# Patient Record
Sex: Female | Born: 1985 | Race: Black or African American | Hispanic: No | Marital: Single | Smoking: Never smoker
Health system: Southern US, Community
[De-identification: ages and names within clinical notes are randomized; demographics above are authoritative.]

## PROBLEM LIST (undated history)

## (undated) DIAGNOSIS — N939 Abnormal uterine and vaginal bleeding, unspecified: Secondary | ICD-10-CM

## (undated) DIAGNOSIS — D649 Anemia, unspecified: Secondary | ICD-10-CM

## (undated) DIAGNOSIS — J45909 Unspecified asthma, uncomplicated: Secondary | ICD-10-CM

## (undated) DIAGNOSIS — G43909 Migraine, unspecified, not intractable, without status migrainosus: Secondary | ICD-10-CM

## (undated) DIAGNOSIS — Y249XXA Unspecified firearm discharge, undetermined intent, initial encounter: Secondary | ICD-10-CM

## (undated) DIAGNOSIS — W3400XA Accidental discharge from unspecified firearms or gun, initial encounter: Secondary | ICD-10-CM

## (undated) HISTORY — PX: SPLENECTOMY: SUR1306

## (undated) HISTORY — PX: CHOLECYSTECTOMY: SHX55

## (undated) HISTORY — PX: LUNG REMOVAL, PARTIAL: SHX233

---

## 2011-03-15 DIAGNOSIS — Z87828 Personal history of other (healed) physical injury and trauma: Secondary | ICD-10-CM | POA: Insufficient documentation

## 2015-09-21 DIAGNOSIS — R630 Anorexia: Secondary | ICD-10-CM | POA: Insufficient documentation

## 2015-09-21 DIAGNOSIS — Z902 Acquired absence of lung [part of]: Secondary | ICD-10-CM | POA: Insufficient documentation

## 2015-09-21 DIAGNOSIS — F4310 Post-traumatic stress disorder, unspecified: Secondary | ICD-10-CM | POA: Insufficient documentation

## 2015-09-21 DIAGNOSIS — J45909 Unspecified asthma, uncomplicated: Secondary | ICD-10-CM | POA: Insufficient documentation

## 2016-03-15 DIAGNOSIS — O42919 Preterm premature rupture of membranes, unspecified as to length of time between rupture and onset of labor, unspecified trimester: Secondary | ICD-10-CM | POA: Insufficient documentation

## 2017-02-20 DIAGNOSIS — Z8719 Personal history of other diseases of the digestive system: Secondary | ICD-10-CM | POA: Insufficient documentation

## 2017-02-20 DIAGNOSIS — Z9889 Other specified postprocedural states: Secondary | ICD-10-CM | POA: Insufficient documentation

## 2019-01-30 ENCOUNTER — Encounter (HOSPITAL_COMMUNITY): Payer: Self-pay

## 2019-01-30 ENCOUNTER — Emergency Department (HOSPITAL_COMMUNITY)
Admission: EM | Admit: 2019-01-30 | Discharge: 2019-01-31 | Discharge status: Against Medical Advice | Payer: Medicaid Other | Attending: Emergency Medicine | Admitting: Emergency Medicine

## 2019-01-30 ENCOUNTER — Emergency Department (HOSPITAL_COMMUNITY): Payer: Medicaid Other

## 2019-01-30 DIAGNOSIS — Z5321 Procedure and treatment not carried out due to patient leaving prior to being seen by health care provider: Secondary | ICD-10-CM | POA: Diagnosis not present

## 2019-01-30 DIAGNOSIS — R509 Fever, unspecified: Secondary | ICD-10-CM | POA: Diagnosis present

## 2019-01-30 HISTORY — DX: Accidental discharge from unspecified firearms or gun, initial encounter: W34.00XA

## 2019-01-30 HISTORY — DX: Unspecified firearm discharge, undetermined intent, initial encounter: Y24.9XXA

## 2019-01-30 HISTORY — DX: Anemia, unspecified: D64.9

## 2019-01-30 LAB — POC SARS CORONAVIRUS 2 AG -  ED: SARS Coronavirus 2 Ag: NEGATIVE

## 2019-01-30 NOTE — ED Triage Notes (Signed)
Pt reports that for the past 2 days she has been having fevers, body aches, diarrhea, dizziness, and loss of appetite

## 2019-01-31 NOTE — ED Notes (Signed)
Pt left AMA and was told by this tech to stay. Pt refused.

## 2019-06-07 ENCOUNTER — Ambulatory Visit
Admission: EM | Admit: 2019-06-07 | Discharge: 2019-06-07 | Disposition: A | Discharge status: Home / Self Care | Payer: Medicaid Other | Attending: Physician Assistant | Admitting: Physician Assistant

## 2019-06-07 DIAGNOSIS — R112 Nausea with vomiting, unspecified: Secondary | ICD-10-CM

## 2019-06-07 LAB — POCT URINALYSIS DIP (MANUAL ENTRY)
Bilirubin, UA: NEGATIVE
Glucose, UA: NEGATIVE mg/dL
Ketones, POC UA: NEGATIVE mg/dL
Leukocytes, UA: NEGATIVE
Nitrite, UA: NEGATIVE
Protein Ur, POC: NEGATIVE mg/dL
Spec Grav, UA: >=1.03 — AB (ref 1.010–1.025)
Urobilinogen, UA: 0.2 E.U./dL
pH, UA: 7 (ref 5.0–8.0)

## 2019-06-07 LAB — POCT URINE PREGNANCY: Preg Test, Ur: NEGATIVE

## 2019-06-07 MED ORDER — ONDANSETRON 4 MG PO TBDP: 4.0000 mg | ORAL_TABLET | Freq: Three times a day (TID) | ORAL | 0 refills | Status: DC | PRN

## 2019-06-07 MED ORDER — FAMOTIDINE 20 MG PO TABS: 20.0000 mg | ORAL_TABLET | Freq: Two times a day (BID) | ORAL | 0 refills | Status: DC
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
## 2019-06-07 NOTE — Discharge Instructions (Signed)
Urine negative for infection/pregnancy at this time. Start zofran as needed for nausea/vomiting. Pepcid for acid reflux. Follow up with PCP/GYN for further evaluation if symptoms not improving.

## 2019-06-07 NOTE — ED Triage Notes (Signed)
Pt c/o vomiting x1wk, unable to keep anything down. C/o mid back and abdominal pain d/t vomiting so much. States may be pregnant

## 2019-08-08 ENCOUNTER — Other Ambulatory Visit: Payer: Self-pay

## 2019-08-08 ENCOUNTER — Inpatient Hospital Stay (HOSPITAL_COMMUNITY)
Admission: AD | Admit: 2019-08-08 | Discharge: 2019-08-08 | Disposition: A | Discharge status: Home / Self Care | Payer: Medicaid Other | Attending: Obstetrics and Gynecology | Admitting: Obstetrics and Gynecology

## 2019-08-08 ENCOUNTER — Encounter (HOSPITAL_COMMUNITY): Payer: Self-pay | Admitting: Obstetrics and Gynecology

## 2019-08-08 DIAGNOSIS — R102 Pelvic and perineal pain: Secondary | ICD-10-CM | POA: Diagnosis not present

## 2019-08-08 DIAGNOSIS — Z881 Allergy status to other antibiotic agents status: Secondary | ICD-10-CM | POA: Insufficient documentation

## 2019-08-08 DIAGNOSIS — Z79899 Other long term (current) drug therapy: Secondary | ICD-10-CM | POA: Diagnosis not present

## 2019-08-08 DIAGNOSIS — O26891 Other specified pregnancy related conditions, first trimester: Secondary | ICD-10-CM | POA: Diagnosis not present

## 2019-08-08 DIAGNOSIS — K5909 Other constipation: Secondary | ICD-10-CM | POA: Diagnosis not present

## 2019-08-08 DIAGNOSIS — O99611 Diseases of the digestive system complicating pregnancy, first trimester: Secondary | ICD-10-CM | POA: Insufficient documentation

## 2019-08-08 DIAGNOSIS — Z3A01 Less than 8 weeks gestation of pregnancy: Secondary | ICD-10-CM | POA: Diagnosis not present

## 2019-08-08 LAB — URINALYSIS, ROUTINE W REFLEX MICROSCOPIC
Bilirubin Urine: NEGATIVE
Glucose, UA: NEGATIVE mg/dL
Ketones, ur: NEGATIVE mg/dL
Leukocytes,Ua: NEGATIVE
Nitrite: POSITIVE — AB
Protein, ur: NEGATIVE mg/dL
Specific Gravity, Urine: 1.025 (ref 1.005–1.030)
pH: 6.5 (ref 5.0–8.0)

## 2019-08-08 LAB — URINALYSIS, MICROSCOPIC (REFLEX)

## 2019-08-08 LAB — POCT PREGNANCY, URINE: Preg Test, Ur: POSITIVE — AB

## 2019-08-08 MED ORDER — DOXYLAMINE-PYRIDOXINE 10-10 MG PO TBEC: 2.0000 | DELAYED_RELEASE_TABLET | Freq: Every day | ORAL | 1 refills | Status: DC

## 2019-08-08 NOTE — Discharge Instructions (Signed)
You have constipation which is hard stools that are difficult to pass. It is important to have regular bowel movements every 1-3 days that are soft and easy to pass. Hard stools increase your risk of hemorrhoids and are very uncomfortable.   To prevent constipation you can increase the amount of fiber in your diet. Examples of foods with fiber are leafy greens, whole grain breads, oatmeal and other grains.  It is also important to drink at least eight 8oz glass of water everyday.   If you have not has a bowel movement in 4-5 days you made need to clean out your bowel.  This will have establish normal movement through your bowel.    Miralax Clean out  Take 8 capfuls of miralax in 64 oz of gatorade. You can use any fluid that appeals to you (gatorade, water, juice)  Continue to drink at least eight 8 oz glasses of water throughout the day  You can repeat with another 8 capfuls of miralax in 64 oz of gatorade if you are not having a large amount of stools  You will need to be at home and close to a bathroom for about 8 hours when you do the above as you may need to go to the bathroom frequently.   After you are cleaned out: - Start Colace100mg  twice daily - Start Miralax once daily - Start a daily fiber supplement like metamucil or citrucel - You can safely use enemas in pregnancy  - if you are having diarrhea you can reduce to Colace once a day or miralax every other day or a 1/2 capful daily.    Round Ligament Pain  The round ligament is a cord of muscle and tissue that helps support the uterus. It can become a source of pain during pregnancy if it becomes stretched or twisted as the baby grows. The pain usually begins in the second trimester (13-28 weeks) of pregnancy, and it can come and go until the baby is delivered. It is not a serious problem, and it does not cause harm to the baby. Round ligament pain is usually a short, sharp, and pinching pain, but it can also be a dull, lingering,  and aching pain. The pain is felt in the lower side of the abdomen or in the groin. It usually starts deep in the groin and moves up to the outside of the hip area. The pain may occur when you:  Suddenly change position, such as quickly going from a sitting to standing position.  Roll over in bed.  Cough or sneeze.  Do physical activity. Follow these instructions at home:   Watch your condition for any changes.  When the pain starts, relax. Then try any of these methods to help with the pain: ? Sitting down. ? Flexing your knees up to your abdomen. ? Lying on your side with one pillow under your abdomen and another pillow between your legs. ? Sitting in a warm bath for 15-20 minutes or until the pain goes away.  Take over-the-counter and prescription medicines only as told by your health care provider.  Move slowly when you sit down or stand up.  Avoid long walks if they cause pain.  Stop or reduce your physical activities if they cause pain.  Keep all follow-up visits as told by your health care provider. This is important. Contact a health care provider if:  Your pain does not go away with treatment.  You feel pain in your back that you  did not have before.  Your medicine is not helping. Get help right away if:  You have a fever or chills.  You develop uterine contractions.  You have vaginal bleeding.  You have nausea or vomiting.  You have diarrhea.  You have pain when you urinate. Summary  Round ligament pain is felt in the lower abdomen or groin. It is usually a short, sharp, and pinching pain. It can also be a dull, lingering, and aching pain.  This pain usually begins in the second trimester (13-28 weeks). It occurs because the uterus is stretching with the growing baby, and it is not harmful to the baby.  You may notice the pain when you suddenly change position, when you cough or sneeze, or during physical activity.  Relaxing, flexing your knees to  your abdomen, lying on one side, or taking a warm bath may help to get rid of the pain.  Get help from your health care provider if the pain does not go away or if you have vaginal bleeding, nausea, vomiting, diarrhea, or painful urination. This information is not intended to replace advice given to you by your health care provider. Make sure you discuss any questions you have with your health care provider. Document Revised: 07/01/2017 Document Reviewed: 07/01/2017 Elsevier Patient Education  2020 [employer].   Constipation, Adult Constipation is when a person:  Poops (has a bowel movement) fewer times in a week than normal.  Has a hard time pooping.  Has poop that is dry, hard, or bigger than normal. Follow these instructions at home: Eating and drinking   Eat foods that have a lot of fiber, such as: ? Fresh fruits and vegetables. ? Whole grains. ? Beans.  Eat less of foods that are high in fat, low in fiber, or overly processed, such as: ? [place] fries. ? Hamburgers. ? Cookies. ? Candy. ? Soda.  Drink enough fluid to keep your pee (urine) clear or pale yellow. General instructions  Exercise regularly or as told by your doctor.  Go to the restroom when you feel like you need to poop. Do not hold it in.  Take over-the-counter and prescription medicines only as told by your doctor. These include any fiber supplements.  Do pelvic floor retraining exercises, such as: ? Doing deep breathing while relaxing your lower belly (abdomen). ? Relaxing your pelvic floor while pooping.  Watch your condition for any changes.  Keep all follow-up visits as told by your doctor. This is important. Contact a doctor if:  You have pain that gets worse.  You have a fever.  You have not pooped for 4 days.  You throw up (vomit).  You are not hungry.  You lose weight.  You are bleeding from the anus.  You have thin, pencil-like poop (stool). Get help right away if:  You  have a fever, and your symptoms suddenly get worse.  You leak poop or have blood in your poop.  Your belly feels hard or bigger than normal (is bloated).  You have very bad belly pain.  You feel dizzy or you faint. This information is not intended to replace advice given to you by your health care provider. Make sure you discuss any questions you have with your health care provider. Document Revised: 12/26/2016 Document Reviewed: 07/04/2015 Elsevier Patient Education  2020 [employer]vier [employer].  [facility] Providers    [facility] [employer] [facility]       Phone: [phone]  [facility] [employer]: [phone]  [facility] [employer] at [place]  Phone: [phone]  [facility] [employer] at [employer]  Phone: [phone]  [facility] [employer] [facility] [place]  Phone: [phone]  [facility]   Phone: [phone]  [facility]       Phone: [phone]  [facility]    Phone: [phone]   [facility]    Phone: [phone]  [facility]    Phone: [phone]  [facility]    Phone: [phone]  [facility]       Phone: [phone]   [facility]  Phone: [phone]  [facility]    Phone: [phone]  [facility]   Phone: [phone]  [facility]      Phone: [phone]  [facility]    Phone: [phone]
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
## 2019-08-08 NOTE — MAU Note (Signed)
.   Rebecca Hatfield is a 34 y.o. at Unknown here in MAU reporting: she had a positive pregnancy test 2 days ago and she has swelling in her legs and she is constipated.  LMP: 06/28/19 Onset of complaint: 2 days Pain score: 8 Vitals:   08/08/19 1406  BP: 135/80  Pulse: 97  Resp: 16  Temp: 98.7 F (37.1 C)  SpO2: 100%     FHT: Lab orders placed from triage: UPT

## 2019-09-02 ENCOUNTER — Inpatient Hospital Stay (EMERGENCY_DEPARTMENT_HOSPITAL)
Admission: AD | Admit: 2019-09-02 | Discharge: 2019-09-03 | Disposition: A | Discharge status: Home / Self Care | Payer: Medicaid Other | Source: Home / Self Care | Attending: Obstetrics and Gynecology | Admitting: Obstetrics and Gynecology

## 2019-09-02 ENCOUNTER — Other Ambulatory Visit: Payer: Self-pay

## 2019-09-02 ENCOUNTER — Encounter (HOSPITAL_COMMUNITY): Payer: Self-pay | Admitting: Obstetrics and Gynecology

## 2019-09-02 ENCOUNTER — Inpatient Hospital Stay (HOSPITAL_COMMUNITY): Payer: Medicaid Other

## 2019-09-02 DIAGNOSIS — Z79899 Other long term (current) drug therapy: Secondary | ICD-10-CM | POA: Insufficient documentation

## 2019-09-02 DIAGNOSIS — M549 Dorsalgia, unspecified: Secondary | ICD-10-CM | POA: Diagnosis not present

## 2019-09-02 DIAGNOSIS — A5901 Trichomonal vulvovaginitis: Secondary | ICD-10-CM

## 2019-09-02 DIAGNOSIS — O26891 Other specified pregnancy related conditions, first trimester: Secondary | ICD-10-CM

## 2019-09-02 DIAGNOSIS — Z881 Allergy status to other antibiotic agents status: Secondary | ICD-10-CM | POA: Insufficient documentation

## 2019-09-02 DIAGNOSIS — Z5321 Procedure and treatment not carried out due to patient leaving prior to being seen by health care provider: Secondary | ICD-10-CM | POA: Diagnosis not present

## 2019-09-02 DIAGNOSIS — Z3A09 9 weeks gestation of pregnancy: Secondary | ICD-10-CM | POA: Diagnosis not present

## 2019-09-02 DIAGNOSIS — R109 Unspecified abdominal pain: Secondary | ICD-10-CM | POA: Diagnosis not present

## 2019-09-02 DIAGNOSIS — Z3A01 Less than 8 weeks gestation of pregnancy: Secondary | ICD-10-CM

## 2019-09-02 DIAGNOSIS — B9689 Other specified bacterial agents as the cause of diseases classified elsewhere: Secondary | ICD-10-CM

## 2019-09-02 DIAGNOSIS — N760 Acute vaginitis: Secondary | ICD-10-CM

## 2019-09-02 DIAGNOSIS — O23591 Infection of other part of genital tract in pregnancy, first trimester: Secondary | ICD-10-CM | POA: Insufficient documentation

## 2019-09-02 DIAGNOSIS — R0602 Shortness of breath: Secondary | ICD-10-CM | POA: Diagnosis present

## 2019-09-02 DIAGNOSIS — Z9049 Acquired absence of other specified parts of digestive tract: Secondary | ICD-10-CM | POA: Insufficient documentation

## 2019-09-02 LAB — URINALYSIS, ROUTINE W REFLEX MICROSCOPIC
Bilirubin Urine: NEGATIVE
Glucose, UA: NEGATIVE mg/dL
Hgb urine dipstick: NEGATIVE
Ketones, ur: NEGATIVE mg/dL
Leukocytes,Ua: NEGATIVE
Nitrite: NEGATIVE
Protein, ur: NEGATIVE mg/dL
Specific Gravity, Urine: 1.015 (ref 1.005–1.030)
pH: 7 (ref 5.0–8.0)

## 2019-09-02 LAB — WET PREP, GENITAL
Sperm: NONE SEEN
Yeast Wet Prep HPF POC: NONE SEEN

## 2019-09-02 MED ORDER — CYCLOBENZAPRINE HCL 5 MG PO TABS
10.0000 mg | ORAL_TABLET | Freq: Once | ORAL | Status: AC
  Administered 2019-09-02: 10 mg via ORAL
  Filled 2019-09-02: qty 2
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
## 2019-09-03 ENCOUNTER — Encounter (HOSPITAL_COMMUNITY): Payer: Self-pay | Admitting: Emergency Medicine

## 2019-09-03 ENCOUNTER — Emergency Department (HOSPITAL_COMMUNITY)
Admission: EM | Admit: 2019-09-03 | Discharge: 2019-09-03 | Disposition: A | Discharge status: Home / Self Care | Payer: Medicaid Other | Attending: Emergency Medicine | Admitting: Emergency Medicine

## 2019-09-03 ENCOUNTER — Emergency Department (HOSPITAL_COMMUNITY): Payer: Medicaid Other

## 2019-09-03 ENCOUNTER — Other Ambulatory Visit: Payer: Self-pay

## 2019-09-03 DIAGNOSIS — O26891 Other specified pregnancy related conditions, first trimester: Secondary | ICD-10-CM

## 2019-09-03 DIAGNOSIS — R109 Unspecified abdominal pain: Secondary | ICD-10-CM

## 2019-09-03 DIAGNOSIS — R0602 Shortness of breath: Secondary | ICD-10-CM | POA: Insufficient documentation

## 2019-09-03 DIAGNOSIS — Z5321 Procedure and treatment not carried out due to patient leaving prior to being seen by health care provider: Secondary | ICD-10-CM | POA: Insufficient documentation

## 2019-09-03 DIAGNOSIS — M549 Dorsalgia, unspecified: Secondary | ICD-10-CM

## 2019-09-03 DIAGNOSIS — Z3A09 9 weeks gestation of pregnancy: Secondary | ICD-10-CM

## 2019-09-03 MED ORDER — METRONIDAZOLE 0.75 % VA GEL: 1.0000 | Freq: Every day | VAGINAL | 0 refills | Status: DC

## 2019-09-03 MED ORDER — METRONIDAZOLE 500 MG PO TABS
2000.0000 mg | ORAL_TABLET | Freq: Once | ORAL | Status: AC
  Administered 2019-09-03: 2000 mg via ORAL
  Filled 2019-09-03: qty 4

## 2019-09-03 MED ORDER — TERCONAZOLE 0.8 % VA CREA: 1.0000 | TOPICAL_CREAM | Freq: Every day | VAGINAL | 0 refills | Status: DC
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
## 2019-09-03 NOTE — ED Notes (Signed)
Pt left due to not being seen quick enough 

## 2019-09-03 NOTE — MAU Note (Signed)
Pt transfered to [facility]. Report called to [facility] triage nurse.

## 2019-09-03 NOTE — Discharge Instructions (Signed)
Trichomoniasis Trichomoniasis is an STI (sexually transmitted infection) that can affect both women and men. In women, the outer area of the female genitalia (vulva) and the vagina are affected. In men, mainly the penis is affected, but the prostate and other reproductive organs can also be involved.  This condition can be treated with medicine. It often has no symptoms (is asymptomatic), especially in men. If not treated, trichomoniasis can last for months or years. What are the causes? This condition is caused by a parasite called Trichomonas vaginalis. Trichomoniasis most often spreads from person to person (is contagious) through sexual contact. What increases the risk? The following factors may make you more likely to develop this condition:  Having unprotected sex.  Having sex with a partner who has trichomoniasis.  Having multiple sexual partners.  Having had previous trichomoniasis infections or other STIs. What are the signs or symptoms? In women, symptoms of trichomoniasis include:  Abnormal vaginal discharge that is clear, white, gray, or yellow-green and foamy and has an unusual "fishy" odor.  Itching and irritation of the vagina and vulva.  Burning or pain during urination or sex.  Redness and swelling of the genitals. In men, symptoms of trichomoniasis include:  Penile discharge that may be foamy or contain pus.  Pain in the penis. This may happen only when urinating.  Itching or irritation inside the penis.  Burning after urination or ejaculation. How is this diagnosed? In women, this condition may be found during a routine Pap test or physical exam. It may be found in men during a routine physical exam. Your health care provider may do tests to help diagnose this infection, such as:  Urine tests (men and women).  The following in women: ? Testing the pH of the vagina. ? A vaginal swab test that checks for the Trichomonas vaginalis parasite. ? Testing vaginal  secretions. Your health care provider may test you for other STIs, including HIV (human immunodeficiency virus). How is this treated? This condition is treated with medicine taken by mouth (orally), such as metronidazole or tinidazole, to fight the infection. Your sexual partner(s) also need to be tested and treated.  If you are a woman and you plan to become pregnant or think you may be pregnant, tell your health care provider right away. Some medicines that are used to treat the infection should not be taken during pregnancy. Your health care provider may recommend over-the-counter medicines or creams to help relieve itching or irritation. You may be tested for infection again 3 months after treatment. Follow these instructions at home:  Take and use over-the-counter and prescription medicines, including creams, only as told by your health care provider.  Take your antibiotic medicine as told by your health care provider. Do not stop taking the antibiotic even if you start to feel better.  Do not have sex until 7-10 days after you finish your medicine, or until your health care provider approves. Ask your health care provider when you may start to have sex again.  (Women) Do not douche or wear tampons while you have the infection.  Discuss your infection with your sexual partner(s). Make sure that your partner gets tested and treated, if necessary.  Keep all follow-up visits as told by your health care provider. This is important. How is this prevented?   Use condoms every time you have sex. Using condoms correctly and consistently can help protect against STIs.  Avoid having multiple sexual partners.  Talk with your sexual partner about any   symptoms that either of you may have, as well as any history of STIs.  Get tested for STIs and STDs (sexually transmitted diseases) before you have sex. Ask your partner to do the same.  Do not have sexual contact if you have symptoms of  trichomoniasis or another STI. Contact a health care provider if:  You still have symptoms after you finish your medicine.  You develop pain in your abdomen.  You have pain when you urinate.  You have bleeding after sex.  You develop a rash.  You feel nauseous or you vomit.  You plan to become pregnant or think you may be pregnant. Summary  Trichomoniasis is an STI (sexually transmitted infection) that can affect both women and men.  This condition often has no symptoms (is asymptomatic), especially in men.  Without treatment, this condition can last for months or years.  You should not have sex until 7-10 days after you finish your medicine, or until your health care provider approves. Ask your health care provider when you may start to have sex again.  Discuss your infection with your sexual partner(s). Make sure that your partner gets tested and treated, if necessary. This information is not intended to replace advice given to you by your health care provider. Make sure you discuss any questions you have with your health care provider. Document Revised: 10/27/2017 Document Reviewed: 10/27/2017 Elsevier Patient Education  2020 Elsevier Inc.  

## 2019-09-05 LAB — GC/CHLAMYDIA PROBE AMP (~~LOC~~) NOT AT ARMC
Chlamydia: NEGATIVE
Comment: NEGATIVE
Comment: NORMAL
Neisseria Gonorrhea: NEGATIVE

## 2019-09-06 ENCOUNTER — Ambulatory Visit: Payer: Medicaid Other

## 2019-09-06 ENCOUNTER — Other Ambulatory Visit: Payer: Self-pay

## 2019-09-06 DIAGNOSIS — A599 Trichomoniasis, unspecified: Secondary | ICD-10-CM

## 2019-09-06 DIAGNOSIS — O0990 Supervision of high risk pregnancy, unspecified, unspecified trimester: Secondary | ICD-10-CM | POA: Insufficient documentation

## 2019-09-06 DIAGNOSIS — Z3491 Encounter for supervision of normal pregnancy, unspecified, first trimester: Secondary | ICD-10-CM

## 2019-09-06 MED ORDER — METRONIDAZOLE 500 MG PO TABS: ORAL_TABLET | ORAL | 0 refills | Status: DC

## 2019-09-06 MED ORDER — BLOOD PRESSURE KIT DEVI: 1.0000 | 0 refills | Status: DC

## 2019-09-06 NOTE — Progress Notes (Signed)
PRENATAL INTAKE SUMMARY  Ms. Draughon presents today New OB Nurse Interview.  OB History    Gravida  5   Para  2   Term  2   Preterm      AB  2   Living  2     SAB  1   TAB  1   Ectopic      Multiple      Live Births  2          I have reviewed the patient's medical, obstetrical, social, and family histories, medications, and available lab results.  SUBJECTIVE She has no unusual complaints. Patient states that she was not able to keep treatment for her trichomonas diagnosis down and would like rx to be sent to the pharmacy again.   OBJECTIVE Initial Physical Exam (New OB)  GENERAL APPEARANCE: alert, well appearing   ASSESSMENT Normal pregnancy  PLAN Prenatal care will be at [facility] labs will be completed at [facility] OB visit

## 2019-09-14 ENCOUNTER — Encounter: Payer: Medicaid Other | Admitting: Obstetrics and Gynecology

## 2019-09-22 ENCOUNTER — Encounter: Payer: Medicaid Other | Admitting: Obstetrics

## 2019-10-13 ENCOUNTER — Encounter: Payer: Self-pay | Admitting: Obstetrics

## 2019-10-13 ENCOUNTER — Ambulatory Visit (INDEPENDENT_AMBULATORY_CARE_PROVIDER_SITE_OTHER): Payer: Medicaid Other | Admitting: Obstetrics

## 2019-10-13 ENCOUNTER — Other Ambulatory Visit (HOSPITAL_COMMUNITY)
Admission: RE | Admit: 2019-10-13 | Discharge: 2019-10-13 | Disposition: A | Discharge status: Home / Self Care | Payer: Medicaid Other | Source: Ambulatory Visit | Attending: Obstetrics | Admitting: Obstetrics

## 2019-10-13 ENCOUNTER — Other Ambulatory Visit: Payer: Self-pay

## 2019-10-13 VITALS — BP 123/80 | HR 70 | Wt 249.4 lb

## 2019-10-13 DIAGNOSIS — Z3A13 13 weeks gestation of pregnancy: Secondary | ICD-10-CM | POA: Diagnosis not present

## 2019-10-13 DIAGNOSIS — K219 Gastro-esophageal reflux disease without esophagitis: Secondary | ICD-10-CM

## 2019-10-13 DIAGNOSIS — O219 Vomiting of pregnancy, unspecified: Secondary | ICD-10-CM

## 2019-10-13 DIAGNOSIS — Z3481 Encounter for supervision of other normal pregnancy, first trimester: Secondary | ICD-10-CM | POA: Diagnosis not present

## 2019-10-13 DIAGNOSIS — Z3490 Encounter for supervision of normal pregnancy, unspecified, unspecified trimester: Secondary | ICD-10-CM

## 2019-10-13 DIAGNOSIS — S31139S Puncture wound of abdominal wall without foreign body, unspecified quadrant without penetration into peritoneal cavity, sequela: Secondary | ICD-10-CM

## 2019-10-13 MED ORDER — OMEPRAZOLE 20 MG PO CPDR: 20.0000 mg | DELAYED_RELEASE_CAPSULE | Freq: Two times a day (BID) | ORAL | 5 refills | Status: DC

## 2019-10-13 MED ORDER — PROMETHAZINE HCL 12.5 MG PO TABS: 12.5000 mg | ORAL_TABLET | Freq: Four times a day (QID) | ORAL | 2 refills | Status: DC | PRN

## 2019-10-13 MED ORDER — DOXYLAMINE-PYRIDOXINE 10-10 MG PO TBEC: 2.0000 | DELAYED_RELEASE_TABLET | Freq: Every day | ORAL | 1 refills | Status: DC
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
## 2019-10-13 NOTE — Progress Notes (Signed)
Pt presents for NOB visit c/o nausea and vomiting.  Unplanned pregnancy, FOB supportive but not living together  Needs PNV  Pap due today Flu vaccine declined

## 2019-10-14 LAB — CERVICOVAGINAL ANCILLARY ONLY
Bacterial Vaginitis (gardnerella): POSITIVE — AB
Candida Glabrata: NEGATIVE
Candida Vaginitis: POSITIVE — AB
Chlamydia: NEGATIVE
Comment: NEGATIVE
Comment: NEGATIVE
Comment: NEGATIVE
Comment: NEGATIVE
Comment: NEGATIVE
Comment: NORMAL
Neisseria Gonorrhea: NEGATIVE
Trichomonas: POSITIVE — AB

## 2019-10-17 LAB — URINE CULTURE, OB REFLEX

## 2019-10-17 LAB — CULTURE, OB URINE

## 2019-10-18 LAB — CYTOLOGY - PAP
Comment: NEGATIVE
Diagnosis: NEGATIVE
High risk HPV: NEGATIVE

## 2019-10-20 ENCOUNTER — Other Ambulatory Visit: Payer: Self-pay | Admitting: Obstetrics

## 2019-10-20 ENCOUNTER — Other Ambulatory Visit: Payer: Self-pay

## 2019-10-20 ENCOUNTER — Encounter: Payer: Self-pay | Admitting: Obstetrics

## 2019-10-20 DIAGNOSIS — A599 Trichomoniasis, unspecified: Secondary | ICD-10-CM

## 2019-10-20 DIAGNOSIS — O2342 Unspecified infection of urinary tract in pregnancy, second trimester: Secondary | ICD-10-CM

## 2019-10-20 MED ORDER — CEFUROXIME AXETIL 500 MG PO TABS: 500.0000 mg | ORAL_TABLET | Freq: Two times a day (BID) | ORAL | 0 refills | Status: DC

## 2019-10-20 MED ORDER — TERCONAZOLE 0.8 % VA CREA: 1.0000 | TOPICAL_CREAM | Freq: Every day | VAGINAL | 0 refills | Status: DC

## 2019-10-20 MED ORDER — PRENATAL 27-0.8 MG PO TABS: 1.0000 | ORAL_TABLET | Freq: Every day | ORAL | 11 refills | Status: DC

## 2019-10-20 MED ORDER — METRONIDAZOLE 500 MG PO TABS: ORAL_TABLET | ORAL | 0 refills | Status: DC

## 2019-10-20 NOTE — Progress Notes (Signed)
Prenatal vitamin rx refill requested by patient.

## 2019-10-21 LAB — CBC/D/PLT+RPR+RH+ABO+RUB AB...
Antibody Screen: NEGATIVE
Basophils Absolute: 0 10*3/uL (ref 0.0–0.2)
Basos: 0 %
EOS (ABSOLUTE): 0.1 10*3/uL (ref 0.0–0.4)
Eos: 1 %
HCV Ab: <0.1 s/co ratio (ref 0.0–0.9)
HIV Screen 4th Generation wRfx: NONREACTIVE
Hematocrit: 37 % (ref 34.0–46.6)
Hemoglobin: 12.7 g/dL (ref 11.1–15.9)
Hepatitis B Surface Ag: NEGATIVE
Immature Grans (Abs): 0 10*3/uL (ref 0.0–0.1)
Immature Granulocytes: 0 %
Lymphocytes Absolute: 2 10*3/uL (ref 0.7–3.1)
Lymphs: 20 %
MCH: 31.2 pg (ref 26.6–33.0)
MCHC: 34.3 g/dL (ref 31.5–35.7)
MCV: 91 fL (ref 79–97)
Monocytes Absolute: 0.8 10*3/uL (ref 0.1–0.9)
Monocytes: 8 %
Neutrophils Absolute: 7.1 10*3/uL — ABNORMAL HIGH (ref 1.4–7.0)
Neutrophils: 71 %
Platelets: 325 10*3/uL (ref 150–450)
RBC: 4.07 x10E6/uL (ref 3.77–5.28)
RDW: 12.3 % (ref 11.7–15.4)
RPR Ser Ql: NONREACTIVE
Rh Factor: POSITIVE
Rubella Antibodies, IGG: <0.9 index — ABNORMAL LOW (ref >0.99)
WBC: 10 10*3/uL (ref 3.4–10.8)

## 2019-10-21 LAB — AFP, SERUM, OPEN SPINA BIFIDA
AFP Value: 19.8 ng/mL
Gest. Age on Collection Date: 13.3 weeks
Maternal Age At EDD: 32.9 yr

## 2019-10-21 LAB — HCV INTERPRETATION

## 2019-10-23 ENCOUNTER — Other Ambulatory Visit: Payer: Self-pay | Admitting: Obstetrics

## 2019-10-24 ENCOUNTER — Encounter: Payer: Self-pay | Admitting: Obstetrics

## 2019-11-10 ENCOUNTER — Other Ambulatory Visit: Payer: Self-pay

## 2019-11-10 ENCOUNTER — Other Ambulatory Visit (HOSPITAL_COMMUNITY)
Admission: RE | Admit: 2019-11-10 | Discharge: 2019-11-10 | Disposition: A | Discharge status: Home / Self Care | Payer: Medicaid Other | Source: Ambulatory Visit

## 2019-11-10 ENCOUNTER — Ambulatory Visit (INDEPENDENT_AMBULATORY_CARE_PROVIDER_SITE_OTHER): Payer: Medicaid Other

## 2019-11-10 VITALS — BP 123/85 | HR 90 | Wt 252.0 lb

## 2019-11-10 DIAGNOSIS — O9A212 Injury, poisoning and certain other consequences of external causes complicating pregnancy, second trimester: Secondary | ICD-10-CM

## 2019-11-10 DIAGNOSIS — Z3A17 17 weeks gestation of pregnancy: Secondary | ICD-10-CM

## 2019-11-10 DIAGNOSIS — Z3481 Encounter for supervision of other normal pregnancy, first trimester: Secondary | ICD-10-CM | POA: Insufficient documentation

## 2019-11-10 DIAGNOSIS — S31139S Puncture wound of abdominal wall without foreign body, unspecified quadrant without penetration into peritoneal cavity, sequela: Secondary | ICD-10-CM

## 2019-11-10 DIAGNOSIS — A599 Trichomoniasis, unspecified: Secondary | ICD-10-CM

## 2019-11-10 MED ORDER — DOUBLE ANTIBIOTIC 500-10000 UNIT/GM EX OINT: 1.0000 "application " | TOPICAL_OINTMENT | Freq: Two times a day (BID) | CUTANEOUS | 0 refills | Status: DC

## 2019-11-10 MED ORDER — MISC. DEVICES MISC: 0 refills | Status: DC

## 2019-11-10 MED ORDER — FLUCONAZOLE 150 MG PO TABS: 150.0000 mg | ORAL_TABLET | Freq: Every day | ORAL | 1 refills | Status: DC

## 2019-11-10 MED ORDER — VITAFOL ULTRA 29-0.6-0.4-200 MG PO CAPS: 1.0000 | ORAL_CAPSULE | Freq: Every day | ORAL | 11 refills | Status: DC
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
## 2019-11-12 LAB — URINE CULTURE, OB REFLEX

## 2019-11-12 LAB — CULTURE, OB URINE

## 2019-11-14 LAB — CERVICOVAGINAL ANCILLARY ONLY
Chlamydia: NEGATIVE
Comment: NEGATIVE
Comment: NEGATIVE
Comment: NORMAL
Neisseria Gonorrhea: NEGATIVE
Trichomonas: POSITIVE — AB

## 2019-11-15 ENCOUNTER — Telehealth: Payer: Self-pay

## 2019-11-15 DIAGNOSIS — A599 Trichomoniasis, unspecified: Secondary | ICD-10-CM

## 2019-11-15 DIAGNOSIS — Z3A18 18 weeks gestation of pregnancy: Secondary | ICD-10-CM

## 2019-11-15 MED ORDER — METRONIDAZOLE 500 MG PO TABS: ORAL_TABLET | ORAL | 0 refills | Status: DC
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
## 2019-11-15 NOTE — Telephone Encounter (Signed)
Rebecca Hatfield [birth year]   Provider attempted to call patient regarding + results.  No answer and no message left.  Message sent to [facility] staff to contact patient.  Rx sent to pharmacy on file.    Cherre Robins MSN, CNM Advanced Practice Provider, [facility] [employer]

## 2019-11-15 NOTE — Telephone Encounter (Signed)
Pt notified of +TRICH results and that Treatment Rx has been sent. Pt made aware to refrain from any unprotected intercourse. Pt states Rx makes her her sick and it's hard to keep down Pt advised to take Rx after completed a meal. Pt voiced understanding.

## 2019-11-24 ENCOUNTER — Ambulatory Visit: Payer: Medicaid Other | Attending: Obstetrics

## 2019-11-24 ENCOUNTER — Other Ambulatory Visit: Payer: Self-pay

## 2019-11-24 DIAGNOSIS — Z3490 Encounter for supervision of normal pregnancy, unspecified, unspecified trimester: Secondary | ICD-10-CM | POA: Diagnosis not present

## 2019-12-08 ENCOUNTER — Ambulatory Visit (INDEPENDENT_AMBULATORY_CARE_PROVIDER_SITE_OTHER): Payer: Medicaid Other | Admitting: Obstetrics & Gynecology

## 2019-12-08 ENCOUNTER — Other Ambulatory Visit: Payer: Self-pay

## 2019-12-08 VITALS — BP 110/72 | HR 92 | Wt 254.1 lb

## 2019-12-08 DIAGNOSIS — Z3481 Encounter for supervision of other normal pregnancy, first trimester: Secondary | ICD-10-CM

## 2019-12-08 MED ORDER — MISC. DEVICES MISC: 0 refills | Status: DC

## 2019-12-08 NOTE — Progress Notes (Signed)
   PRENATAL VISIT NOTE  Subjective:  Rebecca Hatfield is a 34 y.o. W7P7106 at [redacted]w[redacted]d being seen today for ongoing prenatal care.  She is currently monitored for the following issues for this high-risk pregnancy and has Encounter for supervision of normal pregnancy, unspecified, first trimester on their problem list.  Patient reports pelvic pressure.  Contractions: Not present. Vag. Bleeding: None.  Movement: Present. Denies leaking of fluid.   The following portions of the patient's history were reviewed and updated as appropriate: allergies, current medications, past family history, past medical history, past social history, past surgical history and problem list.   Objective:   Vitals:   12/08/19 1442  BP: 110/72  Pulse: 92  Weight: 254 lb 1.6 oz (115.3 kg)    Fetal Status: Fetal Heart Rate (bpm): 151   Movement: Present     General:  Alert, oriented and cooperative. Patient is in no acute distress.  Skin: Skin is warm and dry. No rash noted.   Cardiovascular: Normal heart rate noted  Respiratory: Normal respiratory effort, no problems with respiration noted  Abdomen: Soft, gravid, appropriate for gestational age.  Pain/Pressure: Present     Pelvic: Cervical exam deferred        Extremities: Normal range of motion.  Edema: Trace  Mental Status: Normal mood and affect. Normal behavior. Normal judgment and thought content.   Assessment and Plan:  Pregnancy: [id number] at [redacted]w[redacted]d 1. Encounter for supervision of other normal pregnancy in first trimester Discomforts of pregnancy, will try support belt and hose  Preterm labor symptoms and general obstetric precautions including but not limited to vaginal bleeding, contractions, leaking of fluid and fetal movement were reviewed in detail with the patient. Please refer to After Visit Summary for other counseling recommendations.   Return in about 4 weeks (around 01/05/2020) for 2 hr GTT.  No future appointments.  Scheryl Darter, MD

## 2019-12-08 NOTE — Patient Instructions (Signed)

## 2020-01-05 ENCOUNTER — Other Ambulatory Visit (HOSPITAL_COMMUNITY)
Admission: RE | Admit: 2020-01-05 | Discharge: 2020-01-05 | Disposition: A | Discharge status: Home / Self Care | Payer: Medicaid Other | Source: Ambulatory Visit | Attending: Obstetrics and Gynecology | Admitting: Obstetrics and Gynecology

## 2020-01-05 ENCOUNTER — Encounter: Payer: Self-pay | Admitting: Obstetrics and Gynecology

## 2020-01-05 ENCOUNTER — Ambulatory Visit (INDEPENDENT_AMBULATORY_CARE_PROVIDER_SITE_OTHER): Payer: Medicaid Other | Admitting: Obstetrics and Gynecology

## 2020-01-05 ENCOUNTER — Other Ambulatory Visit: Payer: Self-pay

## 2020-01-05 ENCOUNTER — Other Ambulatory Visit: Payer: Medicaid Other

## 2020-01-05 DIAGNOSIS — Z3481 Encounter for supervision of other normal pregnancy, first trimester: Secondary | ICD-10-CM

## 2020-01-05 DIAGNOSIS — Z3009 Encounter for other general counseling and advice on contraception: Secondary | ICD-10-CM | POA: Insufficient documentation

## 2020-01-05 NOTE — Patient Instructions (Signed)

## 2020-01-05 NOTE — Progress Notes (Signed)
Subjective:  Rebecca Hatfield is a 34 y.o. [id number] at [redacted]w[redacted]d being seen today for ongoing prenatal care.  She is currently monitored for the following issues for this high-risk pregnancy and has Encounter for supervision of normal pregnancy, unspecified, first trimester and Unwanted fertility on their problem list.  Patient reports general discomforts of pregnancy.  Contractions: Not present. Vag. Bleeding: None.  Movement: Present. Denies leaking of fluid.   The following portions of the patient's history were reviewed and updated as appropriate: allergies, current medications, past family history, past medical history, past social history, past surgical history and problem list. Problem list updated.  Objective:   Vitals:   01/05/20 0950  BP: 122/83  Pulse: 96    Fetal Status: Fetal Heart Rate (bpm): 154   Movement: Present     General:  Alert, oriented and cooperative. Patient is in no acute distress.  Skin: Skin is warm and dry. No rash noted.   Cardiovascular: Normal heart rate noted  Respiratory: Normal respiratory effort, no problems with respiration noted  Abdomen: Soft, gravid, appropriate for gestational age. Pain/Pressure: Present     Pelvic:  Cervical exam deferred        Extremities: Normal range of motion.  Edema: Trace  Mental Status: Normal mood and affect. Normal behavior. Normal judgment and thought content.   Urinalysis:      Assessment and Plan:  Pregnancy: [id number] at [redacted]w[redacted]d  1. Encounter for supervision of other normal pregnancy in first trimester Stable Glucola today Tric TOC today Growth scan ordered - Cervicovaginal ancillary only( [place]) - Glucose Tolerance, 2 Hours w/1 Hour - CBC - RPR - HIV Antibody (routine testing w rflx) - [place] MFM OB FOLLOW UP; Future  2. Unwanted fertility BTL papers today  Preterm labor symptoms and general obstetric precautions including but not limited to vaginal bleeding, contractions, leaking of fluid and fetal  movement were reviewed in detail with the patient. Please refer to After Visit Summary for other counseling recommendations.  Return in about 3 weeks (around 01/26/2020) for OB visit, face to face, MD only.   Hermina Staggers, MD

## 2020-01-05 NOTE — Progress Notes (Signed)
ROB  + TRCH on 11/10/19 and 10/13/19 Needs TOC today. Per last note pt scheduled for GTT.   CC: Hemorrhoids, N&V requesting refill on Rx.   *will get pt wt after visit .

## 2020-01-06 LAB — CBC
Hematocrit: 32.1 % — ABNORMAL LOW (ref 34.0–46.6)
Hemoglobin: 11 g/dL — ABNORMAL LOW (ref 11.1–15.9)
MCH: 32.4 pg (ref 26.6–33.0)
MCHC: 34.3 g/dL (ref 31.5–35.7)
MCV: 94 fL (ref 79–97)
Platelets: 351 10*3/uL (ref 150–450)
RBC: 3.4 x10E6/uL — ABNORMAL LOW (ref 3.77–5.28)
RDW: 12.7 % (ref 11.7–15.4)
WBC: 11.1 10*3/uL — ABNORMAL HIGH (ref 3.4–10.8)

## 2020-01-06 LAB — CERVICOVAGINAL ANCILLARY ONLY
Chlamydia: NEGATIVE
Comment: NEGATIVE
Comment: NEGATIVE
Comment: NORMAL
Neisseria Gonorrhea: NEGATIVE
Trichomonas: NEGATIVE

## 2020-01-06 LAB — RPR: RPR Ser Ql: NONREACTIVE

## 2020-01-06 LAB — GLUCOSE TOLERANCE, 2 HOURS W/ 1HR
Glucose, 1 hour: 149 mg/dL (ref 65–179)
Glucose, 2 hour: 95 mg/dL (ref 65–152)
Glucose, Fasting: 89 mg/dL (ref 65–91)

## 2020-01-06 LAB — HIV ANTIBODY (ROUTINE TESTING W REFLEX): HIV Screen 4th Generation wRfx: NONREACTIVE

## 2020-01-24 ENCOUNTER — Other Ambulatory Visit: Payer: Self-pay

## 2020-01-24 ENCOUNTER — Ambulatory Visit: Payer: Medicaid Other | Attending: Obstetrics and Gynecology

## 2020-01-24 ENCOUNTER — Ambulatory Visit: Payer: Medicaid Other | Admitting: *Deleted

## 2020-01-24 ENCOUNTER — Encounter: Payer: Self-pay | Admitting: *Deleted

## 2020-01-24 VITALS — BP 126/76 | HR 92

## 2020-01-24 DIAGNOSIS — S3991XD Unspecified injury of abdomen, subsequent encounter: Secondary | ICD-10-CM

## 2020-01-24 DIAGNOSIS — Z3481 Encounter for supervision of other normal pregnancy, first trimester: Secondary | ICD-10-CM

## 2020-01-25 ENCOUNTER — Other Ambulatory Visit: Payer: Self-pay | Admitting: Nurse Practitioner

## 2020-01-25 DIAGNOSIS — Z9889 Other specified postprocedural states: Secondary | ICD-10-CM

## 2020-01-26 ENCOUNTER — Encounter: Payer: Medicaid Other | Admitting: Nurse Practitioner
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
## 2020-02-06 ENCOUNTER — Other Ambulatory Visit (HOSPITAL_COMMUNITY)
Admission: RE | Admit: 2020-02-06 | Discharge: 2020-02-06 | Disposition: A | Discharge status: Home / Self Care | Payer: Medicaid Other | Source: Ambulatory Visit | Attending: Obstetrics and Gynecology | Admitting: Obstetrics and Gynecology

## 2020-02-06 ENCOUNTER — Ambulatory Visit (INDEPENDENT_AMBULATORY_CARE_PROVIDER_SITE_OTHER): Payer: Medicaid Other | Admitting: Obstetrics and Gynecology

## 2020-02-06 ENCOUNTER — Encounter: Payer: Self-pay | Admitting: Obstetrics and Gynecology

## 2020-02-06 ENCOUNTER — Other Ambulatory Visit: Payer: Self-pay

## 2020-02-06 VITALS — BP 116/80 | HR 108 | Wt 255.0 lb

## 2020-02-06 DIAGNOSIS — Z3491 Encounter for supervision of normal pregnancy, unspecified, first trimester: Secondary | ICD-10-CM

## 2020-02-06 DIAGNOSIS — Z3009 Encounter for other general counseling and advice on contraception: Secondary | ICD-10-CM

## 2020-02-06 MED ORDER — VITAFOL ULTRA 29-0.6-0.4-200 MG PO CAPS: 1.0000 | ORAL_CAPSULE | Freq: Every day | ORAL | 11 refills | Status: DC

## 2020-02-06 MED ORDER — PROMETHAZINE HCL 25 MG PO TABS: 25.0000 mg | ORAL_TABLET | Freq: Four times a day (QID) | ORAL | 1 refills | Status: DC | PRN
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
## 2020-02-06 NOTE — Progress Notes (Signed)
Pt states bleeding today.  Pt states she needs refill on Promethazine.

## 2020-02-09 LAB — CERVICOVAGINAL ANCILLARY ONLY
Bacterial Vaginitis (gardnerella): POSITIVE — AB
Candida Glabrata: NEGATIVE
Candida Vaginitis: NEGATIVE
Chlamydia: NEGATIVE
Comment: NEGATIVE
Comment: NEGATIVE
Comment: NEGATIVE
Comment: NEGATIVE
Comment: NEGATIVE
Comment: NORMAL
Neisseria Gonorrhea: NEGATIVE
Trichomonas: NEGATIVE

## 2020-02-09 MED ORDER — METRONIDAZOLE 500 MG PO TABS: 500.0000 mg | ORAL_TABLET | Freq: Two times a day (BID) | ORAL | 0 refills | Status: DC

## 2020-02-09 NOTE — Addendum Note (Signed)
Addended by: Catalina Antigua on: 02/09/2020 12:55 PM   Modules accepted: Orders

## 2020-02-20 ENCOUNTER — Other Ambulatory Visit: Payer: Self-pay | Admitting: Obstetrics and Gynecology

## 2020-02-20 ENCOUNTER — Encounter: Payer: Self-pay | Admitting: Advanced Practice Midwife

## 2020-02-20 ENCOUNTER — Ambulatory Visit (INDEPENDENT_AMBULATORY_CARE_PROVIDER_SITE_OTHER): Payer: Medicaid Other | Admitting: Advanced Practice Midwife

## 2020-02-20 ENCOUNTER — Other Ambulatory Visit: Payer: Self-pay

## 2020-02-20 VITALS — BP 127/78 | HR 124 | Wt 249.0 lb

## 2020-02-20 DIAGNOSIS — F32A Depression, unspecified: Secondary | ICD-10-CM

## 2020-02-20 DIAGNOSIS — O212 Late vomiting of pregnancy: Secondary | ICD-10-CM

## 2020-02-20 DIAGNOSIS — Z7189 Other specified counseling: Secondary | ICD-10-CM | POA: Diagnosis not present

## 2020-02-20 DIAGNOSIS — Z3491 Encounter for supervision of normal pregnancy, unspecified, first trimester: Secondary | ICD-10-CM

## 2020-02-20 DIAGNOSIS — O98813 Other maternal infectious and parasitic diseases complicating pregnancy, third trimester: Secondary | ICD-10-CM

## 2020-02-20 DIAGNOSIS — O219 Vomiting of pregnancy, unspecified: Secondary | ICD-10-CM

## 2020-02-20 DIAGNOSIS — B3731 Acute candidiasis of vulva and vagina: Secondary | ICD-10-CM

## 2020-02-20 DIAGNOSIS — Z3A32 32 weeks gestation of pregnancy: Secondary | ICD-10-CM

## 2020-02-20 DIAGNOSIS — O2243 Hemorrhoids in pregnancy, third trimester: Secondary | ICD-10-CM

## 2020-02-20 DIAGNOSIS — R102 Pelvic and perineal pain: Secondary | ICD-10-CM

## 2020-02-20 DIAGNOSIS — R109 Unspecified abdominal pain: Secondary | ICD-10-CM

## 2020-02-20 DIAGNOSIS — O99343 Other mental disorders complicating pregnancy, third trimester: Secondary | ICD-10-CM

## 2020-02-20 DIAGNOSIS — O26893 Other specified pregnancy related conditions, third trimester: Secondary | ICD-10-CM

## 2020-02-20 DIAGNOSIS — B373 Candidiasis of vulva and vagina: Secondary | ICD-10-CM

## 2020-02-20 MED ORDER — TERCONAZOLE 0.4 % VA CREA: 1.0000 | TOPICAL_CREAM | Freq: Every day | VAGINAL | 0 refills | Status: DC

## 2020-02-20 MED ORDER — CYCLOBENZAPRINE HCL 10 MG PO TABS: 5.0000 mg | ORAL_TABLET | Freq: Three times a day (TID) | ORAL | 1 refills | Status: DC | PRN

## 2020-02-20 MED ORDER — ONDANSETRON 4 MG PO TBDP: 4.0000 mg | ORAL_TABLET | Freq: Four times a day (QID) | ORAL | 0 refills | Status: DC | PRN

## 2020-02-20 MED ORDER — LIDOCAINE HCL 2 % EX GEL
1.0000 | CUTANEOUS | 0 refills | Status: DC | PRN
Start: 2020-02-20 — End: 2020-03-29

## 2020-02-20 MED ORDER — PANTOPRAZOLE SODIUM 40 MG PO TBEC: 40.0000 mg | DELAYED_RELEASE_TABLET | Freq: Every day | ORAL | 1 refills | Status: DC

## 2020-02-20 MED ORDER — CITALOPRAM HYDROBROMIDE 20 MG PO TABS: 20.0000 mg | ORAL_TABLET | Freq: Every day | ORAL | 3 refills | Status: DC

## 2020-02-20 NOTE — Progress Notes (Signed)
ROB 32 wks   2hr GTT was WNL.  CC: Nausea and vomiting 5 times a day. Rx management sent today has not picked up yet. Not able to eat anything.  Pt notes hemorrhoids states cream is not helping.   Request Rx be resent for yeast since she has not been able to keep food and Rx down.  States support belt is not helping with discomfort.
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
## 2020-02-27 ENCOUNTER — Other Ambulatory Visit: Payer: Self-pay

## 2020-02-27 ENCOUNTER — Ambulatory Visit (INDEPENDENT_AMBULATORY_CARE_PROVIDER_SITE_OTHER): Payer: Medicaid Other | Admitting: Advanced Practice Midwife

## 2020-02-27 VITALS — BP 120/82 | HR 123 | Wt 251.0 lb

## 2020-02-27 DIAGNOSIS — Z3491 Encounter for supervision of normal pregnancy, unspecified, first trimester: Secondary | ICD-10-CM

## 2020-02-27 DIAGNOSIS — O2243 Hemorrhoids in pregnancy, third trimester: Secondary | ICD-10-CM

## 2020-02-27 DIAGNOSIS — Z3A33 33 weeks gestation of pregnancy: Secondary | ICD-10-CM

## 2020-02-27 DIAGNOSIS — R102 Pelvic and perineal pain: Secondary | ICD-10-CM

## 2020-02-27 DIAGNOSIS — F32A Depression, unspecified: Secondary | ICD-10-CM

## 2020-02-27 DIAGNOSIS — O26893 Other specified pregnancy related conditions, third trimester: Secondary | ICD-10-CM

## 2020-02-27 DIAGNOSIS — O26843 Uterine size-date discrepancy, third trimester: Secondary | ICD-10-CM

## 2020-02-27 DIAGNOSIS — O99891 Other specified diseases and conditions complicating pregnancy: Secondary | ICD-10-CM

## 2020-02-27 DIAGNOSIS — O99343 Other mental disorders complicating pregnancy, third trimester: Secondary | ICD-10-CM

## 2020-02-27 DIAGNOSIS — M7918 Myalgia, other site: Secondary | ICD-10-CM

## 2020-02-27 NOTE — Patient Instructions (Signed)
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
## 2020-03-12 ENCOUNTER — Ambulatory Visit (INDEPENDENT_AMBULATORY_CARE_PROVIDER_SITE_OTHER): Payer: Medicaid Other | Admitting: Advanced Practice Midwife

## 2020-03-12 ENCOUNTER — Ambulatory Visit: Payer: Medicaid Other

## 2020-03-12 ENCOUNTER — Other Ambulatory Visit: Payer: Self-pay

## 2020-03-12 VITALS — BP 102/74 | HR 129 | Wt 253.0 lb

## 2020-03-12 DIAGNOSIS — O26843 Uterine size-date discrepancy, third trimester: Secondary | ICD-10-CM

## 2020-03-12 DIAGNOSIS — R030 Elevated blood-pressure reading, without diagnosis of hypertension: Secondary | ICD-10-CM

## 2020-03-12 DIAGNOSIS — O99343 Other mental disorders complicating pregnancy, third trimester: Secondary | ICD-10-CM

## 2020-03-12 DIAGNOSIS — O99891 Other specified diseases and conditions complicating pregnancy: Secondary | ICD-10-CM

## 2020-03-12 DIAGNOSIS — M7918 Myalgia, other site: Secondary | ICD-10-CM

## 2020-03-12 DIAGNOSIS — Z3A35 35 weeks gestation of pregnancy: Secondary | ICD-10-CM

## 2020-03-12 DIAGNOSIS — O0990 Supervision of high risk pregnancy, unspecified, unspecified trimester: Secondary | ICD-10-CM

## 2020-03-12 DIAGNOSIS — F32A Depression, unspecified: Secondary | ICD-10-CM

## 2020-03-12 MED ORDER — CITALOPRAM HYDROBROMIDE 20 MG PO TABS: 20.0000 mg | ORAL_TABLET | Freq: Every day | ORAL | 3 refills | Status: DC
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
## 2020-03-12 NOTE — Patient Instructions (Signed)
Things to Try After 37 weeks to Encourage Labor/Get Ready for Labor:   1.  Try the [employer] at [email or web address].[email or web address] daily to improve baby's position and encourage the onset of labor.  2. Walk a little and rest a little every day.  Change positions often.  3. Cervical Ripening: May try one or both a. Red Raspberry Leaf capsules or tea:  two 300mg  or 400mg  tablets with each meal, 2-3 times a day, or 1-3 cups of tea daily  Potential Side Effects Of Raspberry Leaf:  Most women do not experience any side effects from drinking raspberry leaf tea. However, nausea and loose stools are possible   b. Evening Primrose Oil capsules: take 1 capsule by mouth and place one capsule in the vagina every night.    Some of the potential side effects:  Upset stomach  Loose stools or diarrhea  Headaches  Nausea  4. Sex can also help the cervix ripen and encourage labor onset.    Reasons to return to MAU at [facility]:  Since you are preterm, return to MAU if:  1.  Contractions are 10 minutes apart or less and they becoming more uncomfortable or painful over time 2.  You have a large gush of fluid, or a trickle of fluid that will not stop and you have to wear a pad 3.  You have bleeding that is bright red, heavier than spotting--like menstrual bleeding (spotting can be normal in early labor or after a check of your cervix) 4.  You do not feel the baby moving like he/she normally does

## 2020-03-14 ENCOUNTER — Ambulatory Visit: Payer: Medicaid Other

## 2020-03-14 ENCOUNTER — Other Ambulatory Visit: Payer: Self-pay

## 2020-03-14 ENCOUNTER — Ambulatory Visit: Payer: Medicaid Other | Attending: Obstetrics and Gynecology

## 2020-03-14 ENCOUNTER — Ambulatory Visit: Payer: Medicaid Other | Admitting: *Deleted

## 2020-03-14 ENCOUNTER — Encounter: Payer: Self-pay | Admitting: *Deleted

## 2020-03-14 VITALS — BP 131/84 | HR 94 | Ht 69.0 in | Wt 249.0 lb

## 2020-03-14 DIAGNOSIS — R030 Elevated blood-pressure reading, without diagnosis of hypertension: Secondary | ICD-10-CM

## 2020-03-14 DIAGNOSIS — O0990 Supervision of high risk pregnancy, unspecified, unspecified trimester: Secondary | ICD-10-CM

## 2020-03-14 DIAGNOSIS — O99891 Other specified diseases and conditions complicating pregnancy: Secondary | ICD-10-CM | POA: Diagnosis not present

## 2020-03-14 DIAGNOSIS — O26843 Uterine size-date discrepancy, third trimester: Secondary | ICD-10-CM | POA: Insufficient documentation

## 2020-03-14 DIAGNOSIS — O99713 Diseases of the skin and subcutaneous tissue complicating pregnancy, third trimester: Secondary | ICD-10-CM

## 2020-03-14 DIAGNOSIS — Z3A35 35 weeks gestation of pregnancy: Secondary | ICD-10-CM

## 2020-03-14 DIAGNOSIS — L918 Other hypertrophic disorders of the skin: Secondary | ICD-10-CM | POA: Diagnosis not present

## 2020-03-14 NOTE — Progress Notes (Signed)
Subjective:  Rebecca Hatfield is a 35 y.o. female here for BP check. Pt was seen in office on 03/12/20 with elevated BP.    Hypertension TDS:KAJGOT HA, visual changes, blurred vision, or floaters.   Objective:  LMP 06/28/2019 EDD:  Appearance alert, well appearing, and in no distress. General exam BP noted to be well controlled today in office.    Assessment:   Blood Pressure stable.   Plan:  Current treatment plan is effective, no change in therapy. Keep [place] appt today and follow up with provider in one week.

## 2020-03-21 ENCOUNTER — Ambulatory Visit: Payer: Medicaid Other

## 2020-03-26 ENCOUNTER — Inpatient Hospital Stay (HOSPITAL_COMMUNITY)
Admission: AD | Admit: 2020-03-26 | Discharge: 2020-03-29 | DRG: 807 | Disposition: A | Discharge status: Home / Self Care | Payer: Medicaid Other | Attending: Family Medicine | Admitting: Family Medicine

## 2020-03-26 ENCOUNTER — Other Ambulatory Visit: Payer: Self-pay

## 2020-03-26 ENCOUNTER — Encounter (HOSPITAL_COMMUNITY): Payer: Self-pay | Admitting: Obstetrics and Gynecology

## 2020-03-26 ENCOUNTER — Other Ambulatory Visit (HOSPITAL_COMMUNITY)
Admission: RE | Admit: 2020-03-26 | Discharge: 2020-03-26 | Disposition: A | Discharge status: Home / Self Care | Payer: Medicaid Other | Source: Ambulatory Visit | Attending: Certified Nurse Midwife | Admitting: Certified Nurse Midwife

## 2020-03-26 ENCOUNTER — Ambulatory Visit (INDEPENDENT_AMBULATORY_CARE_PROVIDER_SITE_OTHER): Payer: Medicaid Other | Admitting: Certified Nurse Midwife

## 2020-03-26 VITALS — BP 120/84 | HR 132 | Wt 250.0 lb

## 2020-03-26 DIAGNOSIS — F32A Depression, unspecified: Secondary | ICD-10-CM | POA: Diagnosis present

## 2020-03-26 DIAGNOSIS — F419 Anxiety disorder, unspecified: Secondary | ICD-10-CM | POA: Diagnosis present

## 2020-03-26 DIAGNOSIS — O99344 Other mental disorders complicating childbirth: Secondary | ICD-10-CM | POA: Diagnosis present

## 2020-03-26 DIAGNOSIS — O134 Gestational [pregnancy-induced] hypertension without significant proteinuria, complicating childbirth: Secondary | ICD-10-CM | POA: Diagnosis present

## 2020-03-26 DIAGNOSIS — Z902 Acquired absence of lung [part of]: Secondary | ICD-10-CM | POA: Diagnosis not present

## 2020-03-26 DIAGNOSIS — F4310 Post-traumatic stress disorder, unspecified: Secondary | ICD-10-CM | POA: Diagnosis present

## 2020-03-26 DIAGNOSIS — J45909 Unspecified asthma, uncomplicated: Secondary | ICD-10-CM | POA: Diagnosis present

## 2020-03-26 DIAGNOSIS — Z3009 Encounter for other general counseling and advice on contraception: Secondary | ICD-10-CM | POA: Diagnosis present

## 2020-03-26 DIAGNOSIS — O0990 Supervision of high risk pregnancy, unspecified, unspecified trimester: Secondary | ICD-10-CM | POA: Insufficient documentation

## 2020-03-26 DIAGNOSIS — Z3A37 37 weeks gestation of pregnancy: Secondary | ICD-10-CM

## 2020-03-26 DIAGNOSIS — O9952 Diseases of the respiratory system complicating childbirth: Secondary | ICD-10-CM | POA: Diagnosis present

## 2020-03-26 DIAGNOSIS — Z9889 Other specified postprocedural states: Secondary | ICD-10-CM

## 2020-03-26 DIAGNOSIS — Z87828 Personal history of other (healed) physical injury and trauma: Secondary | ICD-10-CM

## 2020-03-26 DIAGNOSIS — Z20822 Contact with and (suspected) exposure to covid-19: Secondary | ICD-10-CM | POA: Diagnosis present

## 2020-03-26 DIAGNOSIS — O99343 Other mental disorders complicating pregnancy, third trimester: Secondary | ICD-10-CM | POA: Diagnosis present

## 2020-03-26 DIAGNOSIS — Z9081 Acquired absence of spleen: Secondary | ICD-10-CM

## 2020-03-26 DIAGNOSIS — O139 Gestational [pregnancy-induced] hypertension without significant proteinuria, unspecified trimester: Secondary | ICD-10-CM | POA: Diagnosis present

## 2020-03-26 DIAGNOSIS — O4202 Full-term premature rupture of membranes, onset of labor within 24 hours of rupture: Secondary | ICD-10-CM | POA: Diagnosis not present

## 2020-03-26 LAB — PROTEIN / CREATININE RATIO, URINE
Creatinine, Urine: 190.4 mg/dL
Protein Creatinine Ratio: 0.16 mg/mg{Cre} — ABNORMAL HIGH (ref 0.00–0.15)
Total Protein, Urine: 30 mg/dL

## 2020-03-26 LAB — COMPREHENSIVE METABOLIC PANEL
ALT: 66 U/L — ABNORMAL HIGH (ref 0–44)
AST: 59 U/L — ABNORMAL HIGH (ref 15–41)
Albumin: 2.6 g/dL — ABNORMAL LOW (ref 3.5–5.0)
Alkaline Phosphatase: 122 U/L (ref 38–126)
Anion gap: 11 (ref 5–15)
BUN: 6 mg/dL (ref 6–20)
CO2: 19 mmol/L — ABNORMAL LOW (ref 22–32)
Calcium: 8.7 mg/dL — ABNORMAL LOW (ref 8.9–10.3)
Chloride: 104 mmol/L (ref 98–111)
Creatinine, Ser: 0.74 mg/dL (ref 0.44–1.00)
GFR, Estimated: >60 mL/min (ref >60)
Glucose, Bld: 137 mg/dL — ABNORMAL HIGH (ref 70–99)
Potassium: 3.5 mmol/L (ref 3.5–5.1)
Sodium: 134 mmol/L — ABNORMAL LOW (ref 135–145)
Total Bilirubin: 0.7 mg/dL (ref 0.3–1.2)
Total Protein: 6.1 g/dL — ABNORMAL LOW (ref 6.5–8.1)

## 2020-03-26 LAB — CBC
HCT: 34.7 % — ABNORMAL LOW (ref 36.0–46.0)
Hemoglobin: 11.6 g/dL — ABNORMAL LOW (ref 12.0–15.0)
MCH: 31.4 pg (ref 26.0–34.0)
MCHC: 33.4 g/dL (ref 30.0–36.0)
MCV: 94 fL (ref 80.0–100.0)
Platelets: 327 10*3/uL (ref 150–400)
RBC: 3.69 MIL/uL — ABNORMAL LOW (ref 3.87–5.11)
RDW: 13.9 % (ref 11.5–15.5)
WBC: 9.3 10*3/uL (ref 4.0–10.5)
nRBC: 0.3 % — ABNORMAL HIGH (ref 0.0–0.2)

## 2020-03-26 LAB — TYPE AND SCREEN
ABO/RH(D): A POS
Antibody Screen: NEGATIVE

## 2020-03-26 LAB — GROUP B STREP BY PCR: Group B strep by PCR: NEGATIVE

## 2020-03-26 MED ORDER — OXYCODONE-ACETAMINOPHEN 5-325 MG PO TABS: 2.0000 | ORAL_TABLET | ORAL | Status: DC | PRN

## 2020-03-26 MED ORDER — PHENYLEPHRINE 40 MCG/ML (10ML) SYRINGE FOR IV PUSH (FOR BLOOD PRESSURE SUPPORT): 80.0000 ug | PREFILLED_SYRINGE | INTRAVENOUS | Status: DC | PRN

## 2020-03-26 MED ORDER — CITALOPRAM HYDROBROMIDE 20 MG PO TABS
20.0000 mg | ORAL_TABLET | Freq: Every day | ORAL | Status: DC
  Administered 2020-03-26: 20 mg via ORAL
  Filled 2020-03-26: qty 1

## 2020-03-26 MED ORDER — EPHEDRINE 5 MG/ML INJ: 10.0000 mg | INTRAVENOUS | Status: DC | PRN

## 2020-03-26 MED ORDER — TERBUTALINE SULFATE 1 MG/ML IJ SOLN
0.2500 mg | Freq: Once | INTRAMUSCULAR | Status: AC | PRN
  Administered 2020-03-27: 0.25 mg via SUBCUTANEOUS
  Filled 2020-03-26: qty 1

## 2020-03-26 MED ORDER — OXYTOCIN-SODIUM CHLORIDE 30-0.9 UT/500ML-% IV SOLN
2.5000 [IU]/h | INTRAVENOUS | Status: DC
  Filled 2020-03-26: qty 500

## 2020-03-26 MED ORDER — LACTATED RINGERS IV SOLN: INTRAVENOUS | Status: DC

## 2020-03-26 MED ORDER — FENTANYL-BUPIVACAINE-NACL 0.5-0.125-0.9 MG/250ML-% EP SOLN
12.0000 mL/h | EPIDURAL | Status: DC | PRN
  Filled 2020-03-26: qty 250

## 2020-03-26 MED ORDER — CYCLOBENZAPRINE HCL 5 MG PO TABS
10.0000 mg | ORAL_TABLET | Freq: Once | ORAL | Status: AC
  Administered 2020-03-26: 10 mg via ORAL
  Filled 2020-03-26: qty 2

## 2020-03-26 MED ORDER — LIDOCAINE HCL (PF) 1 % IJ SOLN: 30.0000 mL | INTRAMUSCULAR | Status: DC | PRN

## 2020-03-26 MED ORDER — LACTATED RINGERS IV SOLN: 500.0000 mL | Freq: Once | INTRAVENOUS | Status: DC

## 2020-03-26 MED ORDER — MISOPROSTOL 50MCG HALF TABLET
50.0000 ug | ORAL_TABLET | Freq: Once | ORAL | Status: AC
  Administered 2020-03-26: 50 ug via BUCCAL

## 2020-03-26 MED ORDER — MISOPROSTOL 50MCG HALF TABLET
ORAL_TABLET | ORAL | Status: AC
  Administered 2020-03-27: 25 ug via VAGINAL
  Filled 2020-03-26: qty 1

## 2020-03-26 MED ORDER — ACETAMINOPHEN 500 MG PO TABS
1000.0000 mg | ORAL_TABLET | Freq: Once | ORAL | Status: AC
  Administered 2020-03-26: 1000 mg via ORAL
  Filled 2020-03-26: qty 2

## 2020-03-26 MED ORDER — SOD CITRATE-CITRIC ACID 500-334 MG/5ML PO SOLN
30.0000 mL | ORAL | Status: DC | PRN
  Administered 2020-03-26: 30 mL via ORAL
  Filled 2020-03-26: qty 15

## 2020-03-26 MED ORDER — LACTATED RINGERS IV SOLN: 500.0000 mL | INTRAVENOUS | Status: DC | PRN

## 2020-03-26 MED ORDER — ACETAMINOPHEN 325 MG PO TABS: 650.0000 mg | ORAL_TABLET | ORAL | Status: DC | PRN

## 2020-03-26 MED ORDER — OXYTOCIN BOLUS FROM INFUSION
333.0000 mL | Freq: Once | INTRAVENOUS | Status: AC
  Administered 2020-03-27: 333 mL via INTRAVENOUS

## 2020-03-26 MED ORDER — FENTANYL CITRATE (PF) 100 MCG/2ML IJ SOLN
50.0000 ug | INTRAMUSCULAR | Status: DC | PRN
  Administered 2020-03-27 (×3): 100 ug via INTRAVENOUS
  Filled 2020-03-26 (×3): qty 2

## 2020-03-26 MED ORDER — OXYCODONE-ACETAMINOPHEN 5-325 MG PO TABS: 1.0000 | ORAL_TABLET | ORAL | Status: DC | PRN

## 2020-03-26 MED ORDER — ONDANSETRON HCL 4 MG/2ML IJ SOLN
4.0000 mg | Freq: Four times a day (QID) | INTRAMUSCULAR | Status: DC | PRN
  Administered 2020-03-26: 4 mg via INTRAVENOUS
  Filled 2020-03-26: qty 2

## 2020-03-26 MED ORDER — DIPHENHYDRAMINE HCL 50 MG/ML IJ SOLN: 12.5000 mg | INTRAMUSCULAR | Status: DC | PRN
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
## 2020-03-26 NOTE — Progress Notes (Signed)
Pt c/o HA, visual changes, seeing spots in front of the eyes, and swelling in her legs and ankles that is causing pain. She is also having back and butt.

## 2020-03-26 NOTE — MAU Note (Signed)
pt sent from office for elevated B/P. Reports she has had a headache for 2 days and seeing stars today and feeling dizzy. Good fetal movement felt.  Pt c/o increased pelvic and back pressure since this morning. Denies any vag bleeding or leaking at this time.

## 2020-03-27 ENCOUNTER — Encounter (HOSPITAL_COMMUNITY): Payer: Self-pay | Admitting: Obstetrics and Gynecology

## 2020-03-27 DIAGNOSIS — F419 Anxiety disorder, unspecified: Secondary | ICD-10-CM

## 2020-03-27 DIAGNOSIS — F4310 Post-traumatic stress disorder, unspecified: Secondary | ICD-10-CM

## 2020-03-27 DIAGNOSIS — Z3A37 37 weeks gestation of pregnancy: Secondary | ICD-10-CM

## 2020-03-27 DIAGNOSIS — O99344 Other mental disorders complicating childbirth: Secondary | ICD-10-CM

## 2020-03-27 DIAGNOSIS — O134 Gestational [pregnancy-induced] hypertension without significant proteinuria, complicating childbirth: Secondary | ICD-10-CM

## 2020-03-27 DIAGNOSIS — F32A Depression, unspecified: Secondary | ICD-10-CM

## 2020-03-27 DIAGNOSIS — O4202 Full-term premature rupture of membranes, onset of labor within 24 hours of rupture: Secondary | ICD-10-CM

## 2020-03-27 LAB — CBC
HCT: 32.9 % — ABNORMAL LOW (ref 36.0–46.0)
Hemoglobin: 10.9 g/dL — ABNORMAL LOW (ref 12.0–15.0)
MCH: 31.2 pg (ref 26.0–34.0)
MCHC: 33.1 g/dL (ref 30.0–36.0)
MCV: 94.3 fL (ref 80.0–100.0)
Platelets: 293 10*3/uL (ref 150–400)
RBC: 3.49 MIL/uL — ABNORMAL LOW (ref 3.87–5.11)
RDW: 14.1 % (ref 11.5–15.5)
WBC: 7.4 10*3/uL (ref 4.0–10.5)
nRBC: 0.3 % — ABNORMAL HIGH (ref 0.0–0.2)

## 2020-03-27 LAB — RESP PANEL BY RT-PCR (FLU A&B, COVID) ARPGX2
Influenza A by PCR: NEGATIVE
Influenza B by PCR: NEGATIVE
SARS Coronavirus 2 by RT PCR: NEGATIVE

## 2020-03-27 LAB — CERVICOVAGINAL ANCILLARY ONLY
Chlamydia: NEGATIVE
Comment: NEGATIVE
Comment: NEGATIVE
Comment: NORMAL
Neisseria Gonorrhea: NEGATIVE
Trichomonas: NEGATIVE

## 2020-03-27 LAB — RPR: RPR Ser Ql: NONREACTIVE

## 2020-03-27 MED ORDER — MEASLES, MUMPS & RUBELLA VAC IJ SOLR: 0.5000 mL | Freq: Once | INTRAMUSCULAR | Status: DC

## 2020-03-27 MED ORDER — BUTORPHANOL TARTRATE 1 MG/ML IJ SOLN
1.0000 mg | Freq: Once | INTRAMUSCULAR | Status: DC
  Filled 2020-03-27: qty 1

## 2020-03-27 MED ORDER — SENNOSIDES-DOCUSATE SODIUM 8.6-50 MG PO TABS
2.0000 | ORAL_TABLET | ORAL | Status: DC
  Administered 2020-03-28 (×2): 2 via ORAL
  Filled 2020-03-27: qty 2

## 2020-03-27 MED ORDER — MISOPROSTOL 25 MCG QUARTER TABLET
ORAL_TABLET | ORAL | Status: AC
  Filled 2020-03-27: qty 1

## 2020-03-27 MED ORDER — IBUPROFEN 600 MG PO TABS
600.0000 mg | ORAL_TABLET | Freq: Four times a day (QID) | ORAL | Status: DC
  Administered 2020-03-27 – 2020-03-29 (×8): 600 mg via ORAL
  Filled 2020-03-27 (×8): qty 1

## 2020-03-27 MED ORDER — CITALOPRAM HYDROBROMIDE 20 MG PO TABS
20.0000 mg | ORAL_TABLET | Freq: Every day | ORAL | Status: DC
  Administered 2020-03-28 – 2020-03-29 (×2): 20 mg via ORAL
  Filled 2020-03-27 (×2): qty 1

## 2020-03-27 MED ORDER — PRENATAL MULTIVITAMIN CH
1.0000 | ORAL_TABLET | Freq: Every day | ORAL | Status: DC
  Administered 2020-03-28 – 2020-03-29 (×2): 1 via ORAL
  Filled 2020-03-27 (×2): qty 1

## 2020-03-27 MED ORDER — ACETAMINOPHEN 325 MG PO TABS
650.0000 mg | ORAL_TABLET | ORAL | Status: DC | PRN
  Administered 2020-03-27 – 2020-03-29 (×4): 650 mg via ORAL
  Filled 2020-03-27 (×4): qty 2

## 2020-03-27 MED ORDER — COCONUT OIL OIL: 1.0000 "application " | TOPICAL_OIL | Status: DC | PRN

## 2020-03-27 MED ORDER — SIMETHICONE 80 MG PO CHEW: 80.0000 mg | CHEWABLE_TABLET | ORAL | Status: DC | PRN

## 2020-03-27 MED ORDER — TETANUS-DIPHTH-ACELL PERTUSSIS 5-2.5-18.5 LF-MCG/0.5 IM SUSY: 0.5000 mL | PREFILLED_SYRINGE | Freq: Once | INTRAMUSCULAR | Status: DC

## 2020-03-27 MED ORDER — ALBUTEROL SULFATE (2.5 MG/3ML) 0.083% IN NEBU: 2.5000 mg | INHALATION_SOLUTION | RESPIRATORY_TRACT | Status: DC | PRN

## 2020-03-27 MED ORDER — MISOPROSTOL 25 MCG QUARTER TABLET: 25.0000 ug | ORAL_TABLET | Freq: Once | ORAL | Status: AC

## 2020-03-27 MED ORDER — ONDANSETRON HCL 4 MG PO TABS: 4.0000 mg | ORAL_TABLET | ORAL | Status: DC | PRN

## 2020-03-27 MED ORDER — ONDANSETRON HCL 4 MG/2ML IJ SOLN: 4.0000 mg | INTRAMUSCULAR | Status: DC | PRN

## 2020-03-27 MED ORDER — ZOLPIDEM TARTRATE 5 MG PO TABS: 5.0000 mg | ORAL_TABLET | Freq: Every evening | ORAL | Status: DC | PRN

## 2020-03-27 MED ORDER — WITCH HAZEL-GLYCERIN EX PADS: 1.0000 "application " | MEDICATED_PAD | CUTANEOUS | Status: DC | PRN

## 2020-03-27 MED ORDER — PROMETHAZINE HCL 25 MG/ML IJ SOLN: 12.5000 mg | Freq: Once | INTRAMUSCULAR | Status: DC

## 2020-03-27 MED ORDER — DIBUCAINE (PERIANAL) 1 % EX OINT: 1.0000 "application " | TOPICAL_OINTMENT | CUTANEOUS | Status: DC | PRN

## 2020-03-27 MED ORDER — DIPHENHYDRAMINE HCL 25 MG PO CAPS: 25.0000 mg | ORAL_CAPSULE | Freq: Four times a day (QID) | ORAL | Status: DC | PRN

## 2020-03-27 MED ORDER — MISOPROSTOL 50MCG HALF TABLET
50.0000 ug | ORAL_TABLET | Freq: Once | ORAL | Status: AC
  Administered 2020-03-27: 50 ug via BUCCAL
  Filled 2020-03-27: qty 1

## 2020-03-27 MED ORDER — BUTORPHANOL TARTRATE 1 MG/ML IJ SOLN
INTRAMUSCULAR | Status: AC
  Filled 2020-03-27: qty 1

## 2020-03-27 MED ORDER — OXYCODONE HCL 5 MG PO TABS: 5.0000 mg | ORAL_TABLET | ORAL | Status: DC | PRN

## 2020-03-27 MED ORDER — BENZOCAINE-MENTHOL 20-0.5 % EX AERO
1.0000 "application " | INHALATION_SPRAY | CUTANEOUS | Status: DC | PRN
  Administered 2020-03-27: 1 via TOPICAL
  Filled 2020-03-27: qty 56

## 2020-03-27 NOTE — Lactation Note (Signed)
This note was copied from a baby's chart. Lactation Consultation Note  Patient Name: Rebecca Hatfield Date: 03/27/2020   Age:33 hours P3, ETI female infant. Infant had one void since birth. Per mom, she feels infant is latching well and most feedings are 10 to 15 minutes in length. Per mom, she recently finished breastfeeding infant for 10 minutes prior to [facility] entering the room, LC did not observe latch. Mom knows how to hand express. Mom will continue to BF infant according to cues, 8 to 12+ times within 24 hours, STS. LC set up DEBP with mom, mom understands to pump every 3 hours for 15 minutes on initial setting. Mom was expressing colostrum when she was using the DEBP as LC left the room. Mom knows to give infant back any EBM that she has pumped. LC discussed input and output . Mom made aware of O/P services, breastfeeding support groups, community resources, and our phone # for post-discharge questions.  See flow sheet docs. Maternal Data    Feeding    LATCH Score                    Lactation Tools Discussed/Used    Interventions    Discharge    Consult Status      [name] 03/27/2020, 9:18 PM
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
## 2020-03-27 NOTE — Lactation Note (Signed)
This note was copied from a baby's chart. Lactation Consultation Note  Patient Name: Boy Yalda Herd [id number] Date: 03/27/2020 Reason for consult: L&D Initial assessment Age:35 years  Infant was about 45 minutes old when I entered room. He was sleeping on Mom's chest skin-to-skin. He had not yet latched. He was not cueing, but Mom was willing to sit up more so we could see if he would latch. As I uncovered infant and lifted him from Mom's chest to change his position, I noted that his legs were not as pink as they could be (even after changing his position). I notified the L&D nurse, Baron Hamper, RN who came in. I left Mom's room to obtain a container for hand expression.   When I returned to the room, infant was wearing a pulse ox. I attempted to teach Mom hand expression, but she found it uncomfortable. I noted that infant's pulse ox went down to 87% & then quickly up to 90 or 91%. I requested the L&D nurse to come in, which she did immediately. Since infant is 37 weeks and still transitioning, I told Mom we would set her up with a DEBP when she got to the postpartum floor.   I let Phillips Hay, RN (Mom's intended postpartum nurse) know of the plan of setting her up with a pump when she arrived to [facility].    Lurline Hare [facility] 03/27/2020, 11:39 AM

## 2020-03-27 NOTE — Discharge Instructions (Signed)
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
## 2020-03-28 LAB — COMPREHENSIVE METABOLIC PANEL
ALT: 51 U/L — ABNORMAL HIGH (ref 0–44)
AST: 35 U/L (ref 15–41)
Albumin: 2.1 g/dL — ABNORMAL LOW (ref 3.5–5.0)
Alkaline Phosphatase: 98 U/L (ref 38–126)
Anion gap: 8 (ref 5–15)
BUN: 5 mg/dL — ABNORMAL LOW (ref 6–20)
CO2: 23 mmol/L (ref 22–32)
Calcium: 8.9 mg/dL (ref 8.9–10.3)
Chloride: 107 mmol/L (ref 98–111)
Creatinine, Ser: 0.77 mg/dL (ref 0.44–1.00)
GFR, Estimated: >60 mL/min (ref >60)
Glucose, Bld: 115 mg/dL — ABNORMAL HIGH (ref 70–99)
Potassium: 3.7 mmol/L (ref 3.5–5.1)
Sodium: 138 mmol/L (ref 135–145)
Total Bilirubin: 0.5 mg/dL (ref 0.3–1.2)
Total Protein: 5.2 g/dL — ABNORMAL LOW (ref 6.5–8.1)

## 2020-03-28 LAB — CBC
HCT: 31.8 % — ABNORMAL LOW (ref 36.0–46.0)
Hemoglobin: 10.8 g/dL — ABNORMAL LOW (ref 12.0–15.0)
MCH: 32 pg (ref 26.0–34.0)
MCHC: 34 g/dL (ref 30.0–36.0)
MCV: 94.1 fL (ref 80.0–100.0)
Platelets: 314 10*3/uL (ref 150–400)
RBC: 3.38 MIL/uL — ABNORMAL LOW (ref 3.87–5.11)
RDW: 14.3 % (ref 11.5–15.5)
WBC: 10.9 10*3/uL — ABNORMAL HIGH (ref 4.0–10.5)
nRBC: 0.3 % — ABNORMAL HIGH (ref 0.0–0.2)

## 2020-03-28 LAB — STREP GP B NAA: Strep Gp B NAA: NEGATIVE

## 2020-03-28 MED ORDER — AMLODIPINE BESYLATE 5 MG PO TABS
5.0000 mg | ORAL_TABLET | Freq: Every day | ORAL | Status: DC
  Administered 2020-03-28 – 2020-03-29 (×2): 5 mg via ORAL
  Filled 2020-03-28 (×2): qty 1

## 2020-03-28 NOTE — Social Work (Signed)
MOB was referred for history of depression and anxiety.  * Referral screened out by Clinical Social Worker because none of the following criteria appear to apply: ~ History of anxiety/depression during this pregnancy, or of post-partum depression following prior delivery. ~ Diagnosis of anxiety and/or depression within last 3 years. OR * MOB's symptoms currently being treated with medication and/or therapy. Per [facility] records, MOB current involved with a therapist and prescribed Celexa 20mg . MOB scored a 6 on Edinburgh Postpartum Depression Screen.  CSW received consult for hx of marijuana use.  Referral was screened out due to the following: ~MOB had no documented substance use after initial prenatal visit/+UPT. ~MOB had no positive drug screens after initial prenatal visit/+UPT. ~Baby's UDS is negative.  Please consult CSW if current concerns arise or by MOB's request.  CSW will monitor CDS results and make report to Child Protective Services if warranted.  04-04-1977, LCSWA Clinical Social Work [employer]  [phone]

## 2020-03-28 NOTE — Lactation Note (Addendum)
This note was copied from a baby's chart. Lactation Consultation Note  Patient Name: Rebecca Hatfield OACZY'S Date: 03/28/2020   Age:33 hours P3, ETI female infant with -2% weight loss. Per mom, infant had 4 voids and one stool since birth. Per mom, she is breastfeeding infant according to cues, most feedings are 15 minutes in length. LC did not observe latch at this time. Per mom, infant has started to feed more frequently he is cluster feeding today and she not used the DEBP. LC mention the  importance of pumping to help with establish milk supply and infant being ETI, but if mom doesn't  use pump  after latching infant at the breast,  mom can hand express and give infant extra volume of EBM by spoon after each feeding. Mom doesn't have any questions or concerns for LC at this time.  Maternal Data    Feeding    LATCH Score Latch: Grasps breast easily, tongue down, lips flanged, rhythmical sucking.  Audible Swallowing: Spontaneous and intermittent  Type of Nipple: Everted at rest and after stimulation  Comfort (Breast/Nipple): Soft / non-tender  Hold (Positioning): Assistance needed to correctly position infant at breast and maintain latch.  LATCH Score: 9   Lactation Tools Discussed/Used    Interventions    Discharge    Consult Status      [name] 03/28/2020, 5:24 PM

## 2020-03-28 NOTE — Progress Notes (Signed)
POSTPARTUM PROGRESS NOTE  Post Partum Day 1  Subjective:  Rebecca Hatfield is a 35 y.o. [id number] s/p NSVD at [redacted]w[redacted]d.  She reports she is doing well. She reports that she does have a headache this morning. Denies any changes in vision, numbness or tingling in extremities,  No acute events overnight. She denies any problems with ambulating, voiding or po intake. Denies nausea or vomiting.  Pain is well controlled.  Lochia is moderate to heavy.  Objective: Blood pressure 136/81, pulse 85, temperature 98.2 F (36.8 C), temperature source Oral, resp. rate 16, height 5' 8.5" (1.74 m), weight 113.4 kg, last menstrual period 06/28/2019, SpO2 100 %, unknown if currently breastfeeding.  Physical Exam:  General: alert, cooperative and no distress Chest: no respiratory distress Heart:regular rate, distal pulses intact Abdomen: soft, nontender,  Uterine Fundus: firm, appropriately tender DVT Evaluation: No calf swelling or tenderness Extremities: no edema Skin: warm, dry  Recent Labs    03/27/20 0924 03/28/20 0516  HGB 10.9* 10.8*  HCT 32.9* 31.8*    Assessment/Plan: Rebecca Hatfield is a 35 y.o. [id number] s/p NSVD at [redacted]w[redacted]d   PPD#1 - Doing well  Routine postpartum care Patient is concerned about bleeding. Reports large clots in the toilet. CBC is stable this morning. We will continue to monitor.   Patient reports headache this morning. Denies changes in vision. Had pre-E labs collected on arrival which were negative other than mildly elevated LFTs. Patient has not received PRN tylenol since 3/1. Will give tylenol and monitor.   gHTN- Bps overnight all mild range consistently over 135/80. Starting Norvasc 5 mg daily. Continue to monitor Bps.   Contraception: outpatient BTL  Feeding: breast and bottle Dispo: Plan for discharge tomorrow.   LOS: 2 days   Derrel Nip, MD  PGY-2, [facility]  03/28/2020, 6:57 AM

## 2020-03-29 ENCOUNTER — Other Ambulatory Visit: Payer: Self-pay | Admitting: Obstetrics and Gynecology

## 2020-03-29 MED ORDER — MEDROXYPROGESTERONE ACETATE 150 MG/ML IM SUSP
150.0000 mg | Freq: Once | INTRAMUSCULAR | Status: AC
  Administered 2020-03-29: 150 mg via INTRAMUSCULAR
  Filled 2020-03-29: qty 1

## 2020-03-29 MED ORDER — COCONUT OIL OIL: 1.0000 "application " | TOPICAL_OIL | 0 refills | Status: DC | PRN

## 2020-03-29 MED ORDER — AMLODIPINE BESYLATE 5 MG PO TABS: 5.0000 mg | ORAL_TABLET | Freq: Every day | ORAL | 0 refills | Status: DC

## 2020-03-29 MED ORDER — ACETAMINOPHEN 325 MG PO TABS: 650.0000 mg | ORAL_TABLET | Freq: Four times a day (QID) | ORAL | Status: DC | PRN

## 2020-03-29 MED ORDER — IBUPROFEN 600 MG PO TABS: 600.0000 mg | ORAL_TABLET | Freq: Three times a day (TID) | ORAL | 0 refills | Status: DC | PRN

## 2020-03-29 MED ORDER — CITALOPRAM HYDROBROMIDE 20 MG PO TABS: 20.0000 mg | ORAL_TABLET | Freq: Every day | ORAL | 2 refills | Status: DC

## 2020-03-29 MED FILL — IBUPROFEN 600 MG TABLET: 600 | 10 days supply | Qty: 30 | Fill #0

## 2020-03-29 MED FILL — CITALOPRAM HBR 20 MG TABLET: 20 | 30 days supply | Qty: 30 | Fill #0

## 2020-03-29 MED FILL — AMLODIPINE BESYLATE 5 MG TA: 5 | 45 days supply | Qty: 45 | Fill #0

## 2020-03-29 NOTE — Lactation Note (Signed)
This note was copied from a baby's chart. Lactation Consultation Note  Patient Name: Rebecca Hatfield GYKZL'D Date: 03/29/2020 Reason for consult: Follow-up assessment;Mother's request;Early term 37-38.6wks;Infant weight loss Age:33 hours Mom had some concerns infant still hungry after feeding 20 minutes at the breast. Mom started formula as a result. Infant just fed before visit and sleeping. LC not able to see a latch before discharge. LC examined Mom's breasts no sign of nipple trauma or abrasion. LC noted compression stripe on the left breast. LC reviewed with Mom how to get a deep latch, use breast compression and look for swallows during a feeding.  Mom electric pump at home as well.  LC reviewed breastfeeding supplementation guide based on hours after birth. Mom using pacifier. LC reviewed d/c pacifier use for 4 weeks until she establish a good latch.  We also reviewed how to paced bottle feed infant when offering EBM or formula.  Plan 1. To feed based on cues 8-12x in 24 hour period no more than 3 hours without an attempt. Offer both breasts, STS and look for milk transfer          2. Mom to use personal electric pump q 3hrs for 15 minutes for stimulation and increase milk supply.           3. Infant paced bottle feed EBM or formula based on guidelines described above. Mom given instructions by RN and LC to not use powder formula until infant surpasses 3 months due to risk of infection if used incorrectly.            4. Engorgement signs, symptoms, treatment and prevention reviewed.              5. LC services inpatient and outpatient reviewed.  Maternal Data    Feeding Mother's Current Feeding Choice: Breast Milk and Formula  LATCH Score                    Lactation Tools Discussed/Used    Interventions Interventions: Breast feeding basics reviewed;Breast compression;Skin to skin;Support pillows;DEBP;Breast massage;Position options;Hand express;Expressed  milk;Education  Discharge Pump: Personal  Consult Status Consult Status: Complete Date: 03/29/20    Jovoni Borkenhagen  Nicholson-Springer 03/29/2020, 3:32 PM
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
## 2020-04-04 ENCOUNTER — Ambulatory Visit: Payer: Medicaid Other

## 2020-04-24 ENCOUNTER — Ambulatory Visit: Payer: Medicaid Other | Admitting: Obstetrics & Gynecology

## 2020-04-25 ENCOUNTER — Other Ambulatory Visit: Payer: Self-pay | Admitting: Advanced Practice Midwife

## 2020-04-25 DIAGNOSIS — R52 Pain, unspecified: Secondary | ICD-10-CM

## 2020-04-25 DIAGNOSIS — O9089 Other complications of the puerperium, not elsewhere classified: Secondary | ICD-10-CM

## 2020-04-25 DIAGNOSIS — G8929 Other chronic pain: Secondary | ICD-10-CM

## 2020-05-01 ENCOUNTER — Telehealth: Payer: Self-pay

## 2020-05-01 NOTE — Telephone Encounter (Signed)
Left VMM to call and reschedule PP appt. [phone]

## 2020-05-31 ENCOUNTER — Other Ambulatory Visit (HOSPITAL_COMMUNITY): Payer: Self-pay

## 2020-07-09 ENCOUNTER — Other Ambulatory Visit: Payer: Self-pay

## 2020-07-09 ENCOUNTER — Encounter: Payer: Self-pay | Admitting: Physical Therapy

## 2020-07-09 ENCOUNTER — Ambulatory Visit: Payer: Medicaid Other | Attending: Advanced Practice Midwife | Admitting: Physical Therapy

## 2020-07-09 DIAGNOSIS — R252 Cramp and spasm: Secondary | ICD-10-CM | POA: Diagnosis present

## 2020-07-09 DIAGNOSIS — R1030 Lower abdominal pain, unspecified: Secondary | ICD-10-CM | POA: Diagnosis present

## 2020-07-09 DIAGNOSIS — M25552 Pain in left hip: Secondary | ICD-10-CM | POA: Diagnosis present

## 2020-07-09 DIAGNOSIS — R278 Other lack of coordination: Secondary | ICD-10-CM | POA: Diagnosis present

## 2020-07-09 DIAGNOSIS — M25551 Pain in right hip: Secondary | ICD-10-CM | POA: Diagnosis present

## 2020-07-09 DIAGNOSIS — M6281 Muscle weakness (generalized): Secondary | ICD-10-CM | POA: Diagnosis present
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
## 2020-09-03 ENCOUNTER — Telehealth: Payer: Self-pay | Admitting: Physical Therapy

## 2020-09-03 ENCOUNTER — Ambulatory Visit: Payer: Medicaid Other | Attending: Advanced Practice Midwife | Admitting: Physical Therapy

## 2020-09-03 DIAGNOSIS — M25552 Pain in left hip: Secondary | ICD-10-CM | POA: Insufficient documentation

## 2020-09-03 DIAGNOSIS — M25551 Pain in right hip: Secondary | ICD-10-CM | POA: Insufficient documentation

## 2020-09-03 DIAGNOSIS — R1030 Lower abdominal pain, unspecified: Secondary | ICD-10-CM | POA: Insufficient documentation

## 2020-09-03 DIAGNOSIS — R252 Cramp and spasm: Secondary | ICD-10-CM | POA: Insufficient documentation

## 2020-09-03 DIAGNOSIS — R278 Other lack of coordination: Secondary | ICD-10-CM | POA: Insufficient documentation

## 2020-09-03 DIAGNOSIS — M6281 Muscle weakness (generalized): Secondary | ICD-10-CM | POA: Insufficient documentation

## 2020-09-03 NOTE — Telephone Encounter (Signed)
Called patient about her missed appointment today at 1445. She reports she did not get the reminder phone call. She has a new phone so was not logged into My Chart. Patient was reminded of our  attendance policy.  Eulis Foster, PT @8 /08/2020@ 3:33 PM

## 2020-09-12 ENCOUNTER — Ambulatory Visit: Payer: Medicaid Other | Admitting: Physical Therapy

## 2020-09-19 ENCOUNTER — Encounter: Payer: Self-pay | Admitting: Physical Therapy

## 2020-09-19 ENCOUNTER — Ambulatory Visit: Payer: Medicaid Other | Admitting: Physical Therapy

## 2020-09-19 ENCOUNTER — Other Ambulatory Visit: Payer: Self-pay

## 2020-09-19 DIAGNOSIS — R252 Cramp and spasm: Secondary | ICD-10-CM

## 2020-09-19 DIAGNOSIS — R278 Other lack of coordination: Secondary | ICD-10-CM

## 2020-09-19 DIAGNOSIS — M25551 Pain in right hip: Secondary | ICD-10-CM | POA: Diagnosis present

## 2020-09-19 DIAGNOSIS — M25552 Pain in left hip: Secondary | ICD-10-CM | POA: Diagnosis present

## 2020-09-19 DIAGNOSIS — R1030 Lower abdominal pain, unspecified: Secondary | ICD-10-CM | POA: Diagnosis present

## 2020-09-19 DIAGNOSIS — M6281 Muscle weakness (generalized): Secondary | ICD-10-CM

## 2020-09-19 NOTE — Patient Instructions (Signed)
Access Code: [email or web address] URL: [email or web address] Date: 09/19/2020 Prepared by: Eulis Foster  Exercises Supine Hamstring Stretch - 1 x daily - 7 x weekly - 1 sets - 2 reps - 30 sec hold Supine Piriformis Stretch - 1 x daily - 7 x weekly - 1 sets - 2 reps - 30 sec hold Supine Pelvic Floor Stretch - 1 x daily - 7 x weekly - 1 sets - 1 reps - 30 sec hold Supine Piriformis Stretch with Leg Straight - 1 x daily - 7 x weekly - 1 sets - 2 reps - 30 sec hold Cat Cow - 1 x daily - 7 x weekly - 1 sets - 10 reps [facility] [address] [place], [place] [address] Phone # [phone] Fax [phone]
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
## 2020-11-19 ENCOUNTER — Ambulatory Visit: Payer: Medicaid Other | Attending: Advanced Practice Midwife | Admitting: Physical Therapy

## 2020-11-19 ENCOUNTER — Telehealth: Payer: Self-pay | Admitting: Physical Therapy

## 2020-11-19 DIAGNOSIS — R278 Other lack of coordination: Secondary | ICD-10-CM | POA: Insufficient documentation

## 2020-11-19 DIAGNOSIS — M25551 Pain in right hip: Secondary | ICD-10-CM | POA: Insufficient documentation

## 2020-11-19 DIAGNOSIS — M25552 Pain in left hip: Secondary | ICD-10-CM | POA: Insufficient documentation

## 2020-11-19 DIAGNOSIS — M6281 Muscle weakness (generalized): Secondary | ICD-10-CM | POA: Insufficient documentation

## 2020-11-19 DIAGNOSIS — R1030 Lower abdominal pain, unspecified: Secondary | ICD-10-CM | POA: Insufficient documentation

## 2020-11-19 DIAGNOSIS — R252 Cramp and spasm: Secondary | ICD-10-CM | POA: Insufficient documentation

## 2020-11-19 NOTE — Telephone Encounter (Signed)
Called patient about her no-show today at 11:45 and left a message.  Eulis Foster, PT @10 /24/2022@ 1:59 PM

## 2021-03-23 DIAGNOSIS — J90 Pleural effusion, not elsewhere classified: Secondary | ICD-10-CM | POA: Diagnosis not present

## 2021-03-23 DIAGNOSIS — Z902 Acquired absence of lung [part of]: Secondary | ICD-10-CM | POA: Diagnosis not present

## 2021-03-23 DIAGNOSIS — R0602 Shortness of breath: Secondary | ICD-10-CM | POA: Diagnosis not present

## 2021-03-23 DIAGNOSIS — J929 Pleural plaque without asbestos: Secondary | ICD-10-CM | POA: Diagnosis not present

## 2021-03-23 DIAGNOSIS — R059 Cough, unspecified: Secondary | ICD-10-CM | POA: Diagnosis not present

## 2021-03-23 DIAGNOSIS — M546 Pain in thoracic spine: Secondary | ICD-10-CM | POA: Diagnosis not present

## 2021-03-23 DIAGNOSIS — R042 Hemoptysis: Secondary | ICD-10-CM | POA: Diagnosis not present

## 2021-03-23 DIAGNOSIS — J984 Other disorders of lung: Secondary | ICD-10-CM | POA: Diagnosis not present

## 2021-06-13 DIAGNOSIS — N939 Abnormal uterine and vaginal bleeding, unspecified: Secondary | ICD-10-CM | POA: Diagnosis not present

## 2021-06-13 DIAGNOSIS — R102 Pelvic and perineal pain: Secondary | ICD-10-CM | POA: Diagnosis not present

## 2021-06-13 DIAGNOSIS — G8929 Other chronic pain: Secondary | ICD-10-CM | POA: Diagnosis not present

## 2021-06-20 DIAGNOSIS — F4310 Post-traumatic stress disorder, unspecified: Secondary | ICD-10-CM | POA: Diagnosis not present

## 2021-06-20 IMAGING — US US OB COMP LESS 14 WK
1 series · 15 of 24 positions shown · non-contrast
Comparison: None.

CLINICAL DATA: Abdominal pain for 4 days. Estimated gestational age
by LMP is 9 weeks 3 days. Quantitative beta HCG is pending.

EXAM:
OBSTETRIC <14 WK US AND TRANSVAGINAL OB US
TECHNIQUE: Both transabdominal and transvaginal ultrasound examinations were
performed for complete evaluation of the gestation as well as the
maternal uterus, adnexal regions, and pelvic cul-de-sac.
Transvaginal technique was performed to assess early pregnancy.

[Series 1: us ob comp less 14 wk · 15 of 24 slices shown]
[im 1/24]
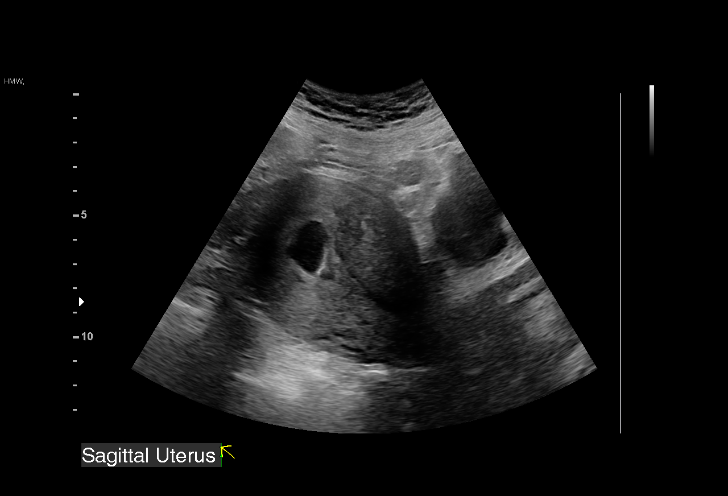
[im 3/24]
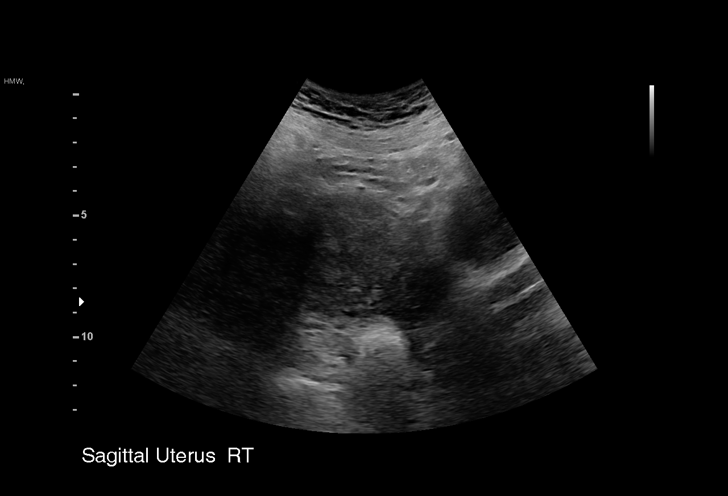
[im 5/24]
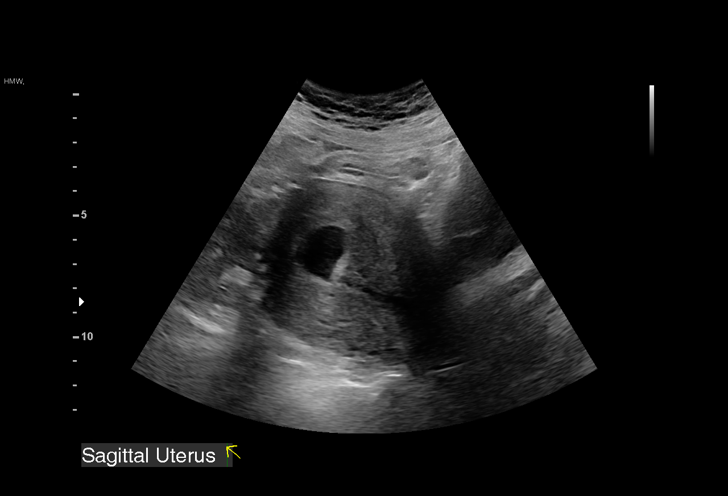
[im 6/24]
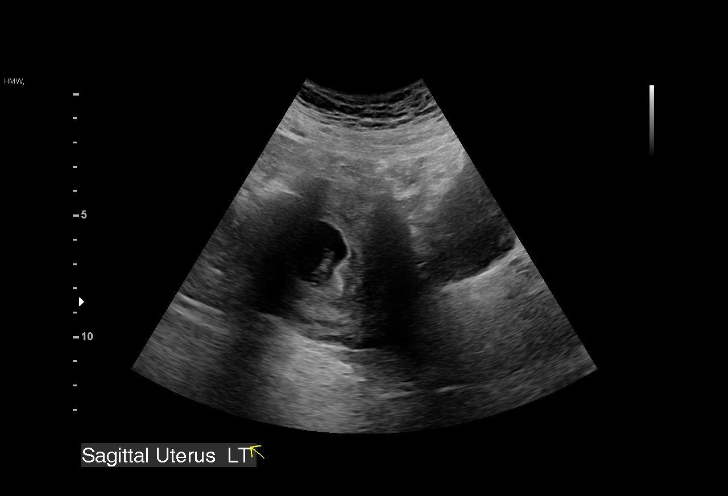
[im 8/24]
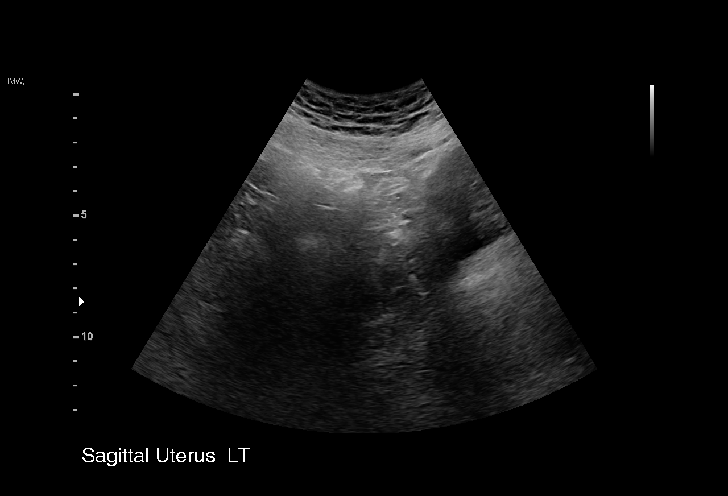
[im 9/24]
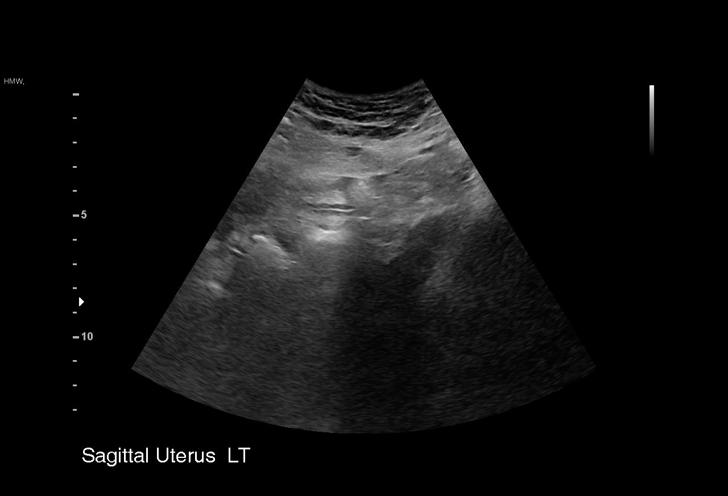
[im 11/24]
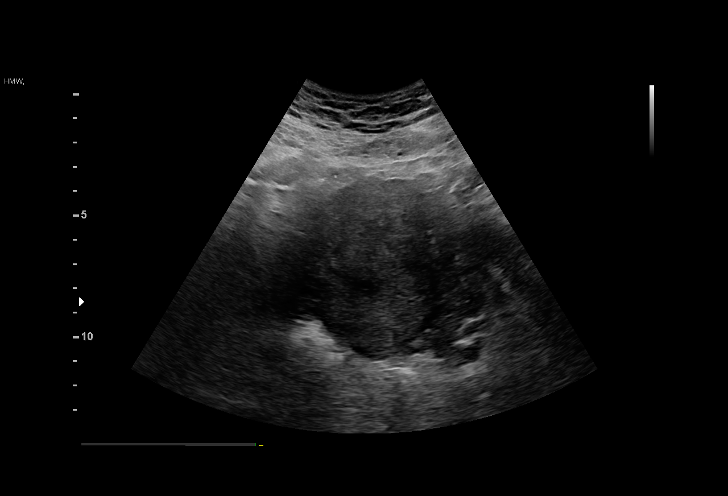
[im 13/24]
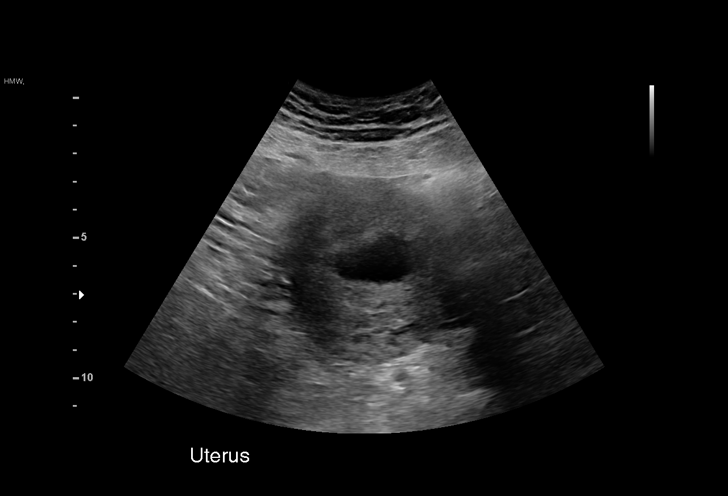
[im 14/24]
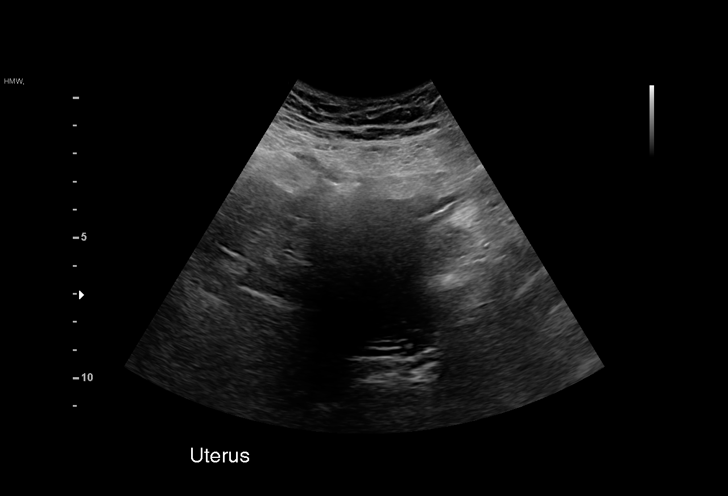
[im 16/24]
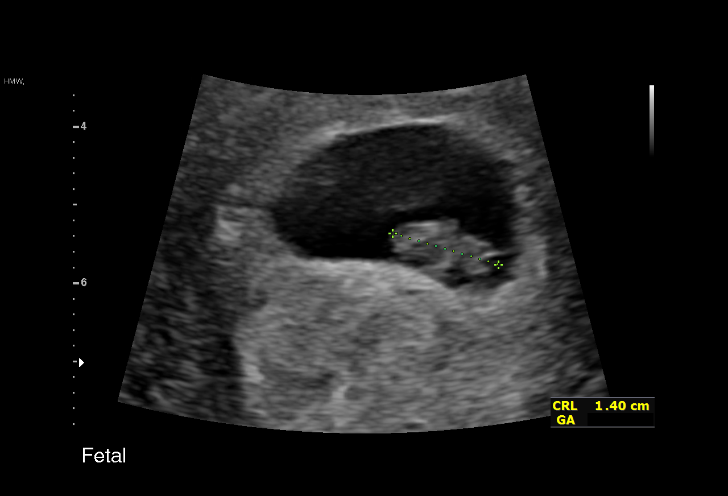
[im 17/24]
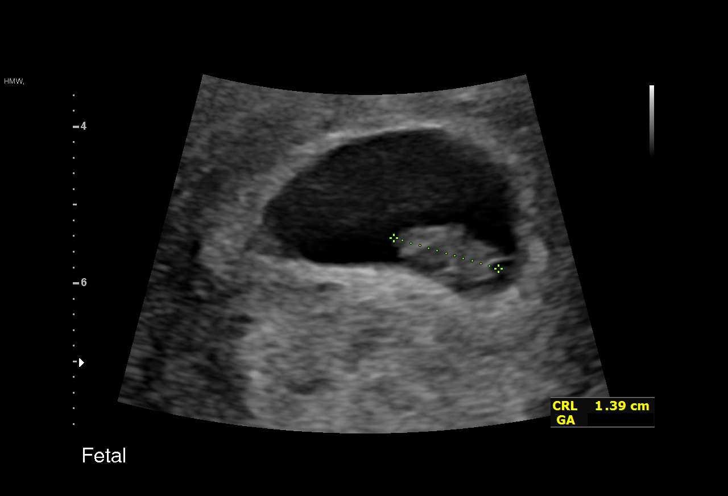
[im 19/24]
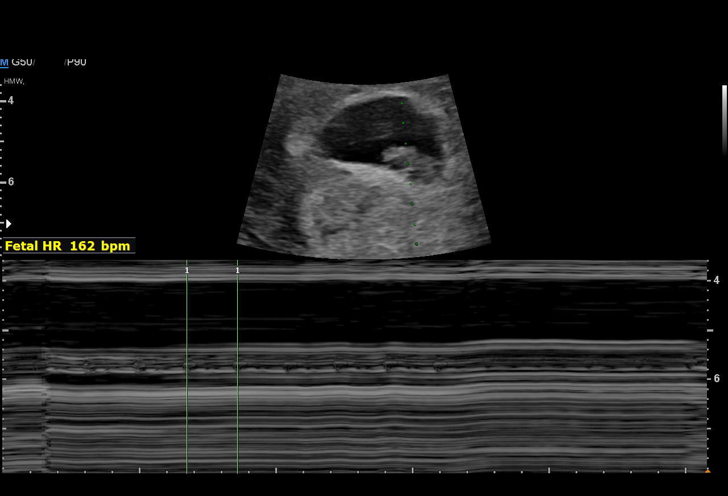
[im 21/24]
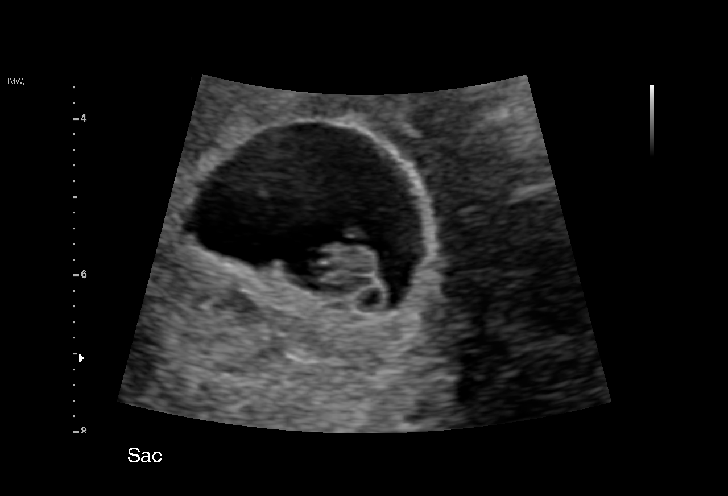
[im 22/24]
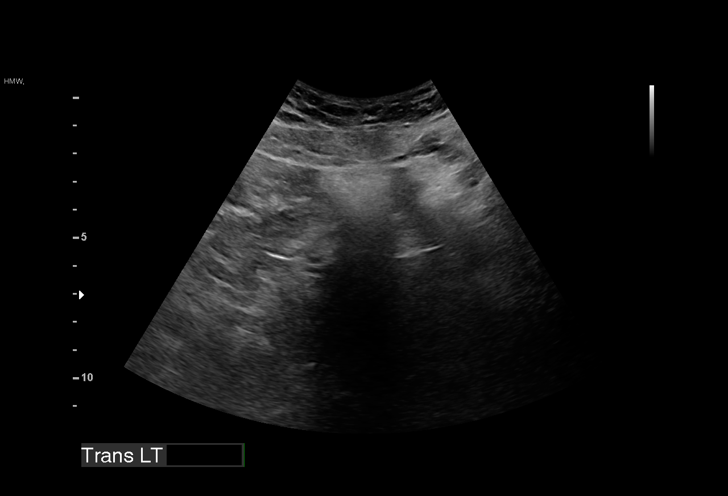
[im 24/24]
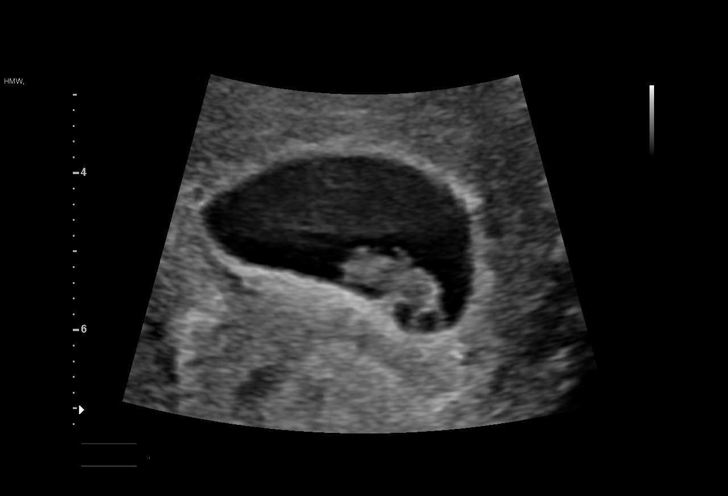

[15 of 24 positions shown; findings below may reference images not displayed]

FINDINGS: Intrauterine gestational sac: A single intrauterine gestational sac
is present.

Yolk sac:  Yolk sac is present.

Embryo:  Fetal pole is present.

Cardiac Activity: Fetal cardiac activity is observed.

Heart Rate: 162 bpm

CRL: 14 mm   7 w   5 d                  US EDC: 04/15/2020

Subchorionic hemorrhage:  None visualized.

Maternal uterus/adnexae: No uterine masses identified. The uterus is
anteverted. Ovaries are not visualized. No free fluid.
IMPRESSION: Single intrauterine pregnancy. Estimated gestational age by
crown-rump length is 7 weeks 5 days. No acute complication is
demonstrated.

## 2021-07-09 DIAGNOSIS — F4310 Post-traumatic stress disorder, unspecified: Secondary | ICD-10-CM | POA: Diagnosis not present

## 2021-07-24 DIAGNOSIS — H9201 Otalgia, right ear: Secondary | ICD-10-CM | POA: Diagnosis not present

## 2021-08-29 DIAGNOSIS — H9201 Otalgia, right ear: Secondary | ICD-10-CM | POA: Diagnosis not present
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
## 2021-10-08 DIAGNOSIS — F411 Generalized anxiety disorder: Secondary | ICD-10-CM | POA: Diagnosis not present

## 2021-11-08 DIAGNOSIS — F411 Generalized anxiety disorder: Secondary | ICD-10-CM | POA: Diagnosis not present

## 2021-12-07 DIAGNOSIS — R109 Unspecified abdominal pain: Secondary | ICD-10-CM | POA: Diagnosis not present

## 2021-12-07 DIAGNOSIS — Z883 Allergy status to other anti-infective agents status: Secondary | ICD-10-CM | POA: Diagnosis not present

## 2021-12-07 DIAGNOSIS — Z9081 Acquired absence of spleen: Secondary | ICD-10-CM | POA: Diagnosis not present

## 2021-12-07 DIAGNOSIS — Z87891 Personal history of nicotine dependence: Secondary | ICD-10-CM | POA: Diagnosis not present

## 2021-12-07 DIAGNOSIS — R1032 Left lower quadrant pain: Secondary | ICD-10-CM | POA: Diagnosis not present

## 2022-02-22 DIAGNOSIS — J90 Pleural effusion, not elsewhere classified: Secondary | ICD-10-CM | POA: Diagnosis not present

## 2022-02-22 DIAGNOSIS — J984 Other disorders of lung: Secondary | ICD-10-CM | POA: Diagnosis not present

## 2022-02-22 DIAGNOSIS — R918 Other nonspecific abnormal finding of lung field: Secondary | ICD-10-CM | POA: Diagnosis not present

## 2022-02-22 DIAGNOSIS — J45909 Unspecified asthma, uncomplicated: Secondary | ICD-10-CM | POA: Diagnosis not present

## 2022-02-22 DIAGNOSIS — Z20822 Contact with and (suspected) exposure to covid-19: Secondary | ICD-10-CM | POA: Diagnosis not present

## 2022-02-22 DIAGNOSIS — R42 Dizziness and giddiness: Secondary | ICD-10-CM | POA: Diagnosis not present

## 2022-02-22 DIAGNOSIS — Z9889 Other specified postprocedural states: Secondary | ICD-10-CM | POA: Diagnosis not present

## 2022-02-22 DIAGNOSIS — R0602 Shortness of breath: Secondary | ICD-10-CM | POA: Diagnosis not present

## 2022-02-22 DIAGNOSIS — Z883 Allergy status to other anti-infective agents status: Secondary | ICD-10-CM | POA: Diagnosis not present

## 2022-02-22 DIAGNOSIS — F17210 Nicotine dependence, cigarettes, uncomplicated: Secondary | ICD-10-CM | POA: Diagnosis not present

## 2022-02-22 DIAGNOSIS — Z79899 Other long term (current) drug therapy: Secondary | ICD-10-CM | POA: Diagnosis not present

## 2022-02-22 DIAGNOSIS — R0600 Dyspnea, unspecified: Secondary | ICD-10-CM | POA: Diagnosis not present

## 2022-02-23 DIAGNOSIS — J984 Other disorders of lung: Secondary | ICD-10-CM | POA: Diagnosis not present

## 2022-02-23 DIAGNOSIS — Z9889 Other specified postprocedural states: Secondary | ICD-10-CM | POA: Diagnosis not present

## 2022-02-23 DIAGNOSIS — J90 Pleural effusion, not elsewhere classified: Secondary | ICD-10-CM | POA: Diagnosis not present

## 2022-02-23 DIAGNOSIS — R0602 Shortness of breath: Secondary | ICD-10-CM | POA: Diagnosis not present

## 2022-02-23 DIAGNOSIS — R918 Other nonspecific abnormal finding of lung field: Secondary | ICD-10-CM | POA: Diagnosis not present

## 2022-02-27 DIAGNOSIS — K6389 Other specified diseases of intestine: Secondary | ICD-10-CM | POA: Diagnosis not present

## 2022-02-27 DIAGNOSIS — K648 Other hemorrhoids: Secondary | ICD-10-CM | POA: Diagnosis not present

## 2022-02-27 DIAGNOSIS — K921 Melena: Secondary | ICD-10-CM | POA: Diagnosis not present

## 2022-03-15 DIAGNOSIS — R062 Wheezing: Secondary | ICD-10-CM | POA: Diagnosis not present

## 2022-03-25 DIAGNOSIS — Z634 Disappearance and death of family member: Secondary | ICD-10-CM | POA: Diagnosis not present

## 2022-03-25 DIAGNOSIS — F4320 Adjustment disorder, unspecified: Secondary | ICD-10-CM | POA: Diagnosis not present

## 2022-03-25 DIAGNOSIS — F430 Acute stress reaction: Secondary | ICD-10-CM | POA: Diagnosis not present

## 2022-03-25 DIAGNOSIS — Z308 Encounter for other contraceptive management: Secondary | ICD-10-CM | POA: Diagnosis not present

## 2022-03-28 DIAGNOSIS — R0981 Nasal congestion: Secondary | ICD-10-CM | POA: Diagnosis not present

## 2022-03-28 DIAGNOSIS — Z87891 Personal history of nicotine dependence: Secondary | ICD-10-CM | POA: Diagnosis not present

## 2022-03-28 DIAGNOSIS — D72829 Elevated white blood cell count, unspecified: Secondary | ICD-10-CM | POA: Diagnosis not present

## 2022-03-28 DIAGNOSIS — J45909 Unspecified asthma, uncomplicated: Secondary | ICD-10-CM | POA: Diagnosis not present

## 2022-03-28 DIAGNOSIS — A419 Sepsis, unspecified organism: Secondary | ICD-10-CM | POA: Diagnosis not present

## 2022-03-28 DIAGNOSIS — Z902 Acquired absence of lung [part of]: Secondary | ICD-10-CM | POA: Diagnosis not present

## 2022-03-28 DIAGNOSIS — F32A Depression, unspecified: Secondary | ICD-10-CM | POA: Diagnosis not present

## 2022-03-28 DIAGNOSIS — Z1152 Encounter for screening for COVID-19: Secondary | ICD-10-CM | POA: Diagnosis not present

## 2022-03-28 DIAGNOSIS — E6609 Other obesity due to excess calories: Secondary | ICD-10-CM | POA: Diagnosis not present

## 2022-03-28 DIAGNOSIS — R059 Cough, unspecified: Secondary | ICD-10-CM | POA: Diagnosis not present

## 2022-03-28 DIAGNOSIS — H53149 Visual discomfort, unspecified: Secondary | ICD-10-CM | POA: Diagnosis not present

## 2022-03-28 DIAGNOSIS — R109 Unspecified abdominal pain: Secondary | ICD-10-CM | POA: Diagnosis not present

## 2022-03-28 DIAGNOSIS — Z20822 Contact with and (suspected) exposure to covid-19: Secondary | ICD-10-CM | POA: Diagnosis not present

## 2022-03-28 DIAGNOSIS — N10 Acute tubulo-interstitial nephritis: Secondary | ICD-10-CM | POA: Diagnosis not present

## 2022-03-28 DIAGNOSIS — F419 Anxiety disorder, unspecified: Secondary | ICD-10-CM | POA: Diagnosis not present

## 2022-03-28 DIAGNOSIS — R110 Nausea: Secondary | ICD-10-CM | POA: Diagnosis not present

## 2022-03-28 DIAGNOSIS — R519 Headache, unspecified: Secondary | ICD-10-CM | POA: Diagnosis not present

## 2022-03-28 DIAGNOSIS — F4310 Post-traumatic stress disorder, unspecified: Secondary | ICD-10-CM | POA: Diagnosis not present

## 2022-03-28 DIAGNOSIS — Z9089 Acquired absence of other organs: Secondary | ICD-10-CM | POA: Diagnosis not present

## 2022-03-28 DIAGNOSIS — R509 Fever, unspecified: Secondary | ICD-10-CM | POA: Diagnosis not present

## 2022-03-28 DIAGNOSIS — R6883 Chills (without fever): Secondary | ICD-10-CM | POA: Diagnosis not present

## 2022-03-28 DIAGNOSIS — A4151 Sepsis due to Escherichia coli [E. coli]: Secondary | ICD-10-CM | POA: Diagnosis not present

## 2022-03-28 DIAGNOSIS — N12 Tubulo-interstitial nephritis, not specified as acute or chronic: Secondary | ICD-10-CM | POA: Diagnosis not present

## 2022-03-28 DIAGNOSIS — Z6835 Body mass index (BMI) 35.0-35.9, adult: Secondary | ICD-10-CM | POA: Diagnosis not present

## 2022-03-28 DIAGNOSIS — J029 Acute pharyngitis, unspecified: Secondary | ICD-10-CM | POA: Diagnosis not present

## 2022-03-29 DIAGNOSIS — F32A Depression, unspecified: Secondary | ICD-10-CM | POA: Diagnosis not present

## 2022-03-29 DIAGNOSIS — N10 Acute tubulo-interstitial nephritis: Secondary | ICD-10-CM | POA: Diagnosis not present

## 2022-03-29 DIAGNOSIS — A4151 Sepsis due to Escherichia coli [E. coli]: Secondary | ICD-10-CM | POA: Diagnosis not present

## 2022-03-29 DIAGNOSIS — F419 Anxiety disorder, unspecified: Secondary | ICD-10-CM | POA: Diagnosis not present

## 2022-03-30 DIAGNOSIS — A4151 Sepsis due to Escherichia coli [E. coli]: Secondary | ICD-10-CM | POA: Diagnosis not present

## 2022-03-30 DIAGNOSIS — F32A Depression, unspecified: Secondary | ICD-10-CM | POA: Diagnosis not present

## 2022-03-30 DIAGNOSIS — N10 Acute tubulo-interstitial nephritis: Secondary | ICD-10-CM | POA: Diagnosis not present

## 2022-03-30 DIAGNOSIS — F419 Anxiety disorder, unspecified: Secondary | ICD-10-CM | POA: Diagnosis not present

## 2022-04-01 DIAGNOSIS — A4151 Sepsis due to Escherichia coli [E. coli]: Secondary | ICD-10-CM | POA: Diagnosis not present

## 2022-04-01 DIAGNOSIS — F419 Anxiety disorder, unspecified: Secondary | ICD-10-CM | POA: Diagnosis not present

## 2022-04-01 DIAGNOSIS — N10 Acute tubulo-interstitial nephritis: Secondary | ICD-10-CM | POA: Diagnosis not present

## 2022-04-01 DIAGNOSIS — F32A Depression, unspecified: Secondary | ICD-10-CM | POA: Diagnosis not present

## 2022-04-10 DIAGNOSIS — R3589 Other polyuria: Secondary | ICD-10-CM | POA: Diagnosis not present

## 2022-04-10 DIAGNOSIS — R1032 Left lower quadrant pain: Secondary | ICD-10-CM | POA: Diagnosis not present

## 2022-04-10 DIAGNOSIS — R109 Unspecified abdominal pain: Secondary | ICD-10-CM | POA: Diagnosis not present

## 2022-04-10 DIAGNOSIS — R300 Dysuria: Secondary | ICD-10-CM | POA: Diagnosis not present

## 2022-04-10 DIAGNOSIS — N12 Tubulo-interstitial nephritis, not specified as acute or chronic: Secondary | ICD-10-CM | POA: Diagnosis not present

## 2022-04-10 DIAGNOSIS — R1110 Vomiting, unspecified: Secondary | ICD-10-CM | POA: Diagnosis not present

## 2022-04-10 DIAGNOSIS — R197 Diarrhea, unspecified: Secondary | ICD-10-CM | POA: Diagnosis not present

## 2022-04-11 DIAGNOSIS — R109 Unspecified abdominal pain: Secondary | ICD-10-CM | POA: Diagnosis not present

## 2022-04-11 DIAGNOSIS — N12 Tubulo-interstitial nephritis, not specified as acute or chronic: Secondary | ICD-10-CM | POA: Diagnosis not present

## 2022-04-15 DIAGNOSIS — R300 Dysuria: Secondary | ICD-10-CM | POA: Diagnosis not present

## 2022-04-15 DIAGNOSIS — R197 Diarrhea, unspecified: Secondary | ICD-10-CM | POA: Diagnosis not present

## 2022-04-15 DIAGNOSIS — N12 Tubulo-interstitial nephritis, not specified as acute or chronic: Secondary | ICD-10-CM | POA: Diagnosis not present

## 2022-04-15 DIAGNOSIS — R112 Nausea with vomiting, unspecified: Secondary | ICD-10-CM | POA: Diagnosis not present

## 2022-04-15 DIAGNOSIS — F411 Generalized anxiety disorder: Secondary | ICD-10-CM | POA: Diagnosis not present

## 2022-05-14 DIAGNOSIS — J029 Acute pharyngitis, unspecified: Secondary | ICD-10-CM | POA: Diagnosis not present

## 2022-05-14 DIAGNOSIS — R051 Acute cough: Secondary | ICD-10-CM | POA: Diagnosis not present

## 2022-05-15 DIAGNOSIS — F411 Generalized anxiety disorder: Secondary | ICD-10-CM | POA: Diagnosis not present

## 2022-06-09 DIAGNOSIS — F4310 Post-traumatic stress disorder, unspecified: Secondary | ICD-10-CM | POA: Diagnosis not present

## 2022-06-12 DIAGNOSIS — F411 Generalized anxiety disorder: Secondary | ICD-10-CM | POA: Diagnosis not present

## 2022-07-14 DIAGNOSIS — F329 Major depressive disorder, single episode, unspecified: Secondary | ICD-10-CM | POA: Diagnosis not present

## 2022-08-13 DIAGNOSIS — R051 Acute cough: Secondary | ICD-10-CM | POA: Diagnosis not present

## 2022-08-13 DIAGNOSIS — J45901 Unspecified asthma with (acute) exacerbation: Secondary | ICD-10-CM | POA: Diagnosis not present

## 2022-08-13 DIAGNOSIS — Z87891 Personal history of nicotine dependence: Secondary | ICD-10-CM | POA: Diagnosis not present

## 2022-08-13 DIAGNOSIS — S29012A Strain of muscle and tendon of back wall of thorax, initial encounter: Secondary | ICD-10-CM | POA: Diagnosis not present

## 2022-08-13 DIAGNOSIS — J4541 Moderate persistent asthma with (acute) exacerbation: Secondary | ICD-10-CM | POA: Diagnosis not present

## 2022-08-13 DIAGNOSIS — J984 Other disorders of lung: Secondary | ICD-10-CM | POA: Diagnosis not present

## 2022-08-13 DIAGNOSIS — R0781 Pleurodynia: Secondary | ICD-10-CM | POA: Diagnosis not present

## 2022-08-13 DIAGNOSIS — R0602 Shortness of breath: Secondary | ICD-10-CM | POA: Diagnosis not present

## 2022-08-14 DIAGNOSIS — F4323 Adjustment disorder with mixed anxiety and depressed mood: Secondary | ICD-10-CM | POA: Diagnosis not present

## 2022-08-15 DIAGNOSIS — I445 Left posterior fascicular block: Secondary | ICD-10-CM | POA: Diagnosis not present

## 2022-08-15 DIAGNOSIS — Z309 Encounter for contraceptive management, unspecified: Secondary | ICD-10-CM | POA: Diagnosis not present

## 2022-08-15 DIAGNOSIS — E669 Obesity, unspecified: Secondary | ICD-10-CM | POA: Diagnosis not present

## 2022-09-11 DIAGNOSIS — F329 Major depressive disorder, single episode, unspecified: Secondary | ICD-10-CM | POA: Diagnosis not present

## 2022-09-23 DIAGNOSIS — B349 Viral infection, unspecified: Secondary | ICD-10-CM | POA: Diagnosis not present

## 2022-09-23 DIAGNOSIS — R051 Acute cough: Secondary | ICD-10-CM | POA: Diagnosis not present

## 2022-09-23 DIAGNOSIS — M791 Myalgia, unspecified site: Secondary | ICD-10-CM | POA: Diagnosis not present

## 2022-09-23 DIAGNOSIS — R5383 Other fatigue: Secondary | ICD-10-CM | POA: Diagnosis not present

## 2022-10-07 DIAGNOSIS — R0602 Shortness of breath: Secondary | ICD-10-CM | POA: Diagnosis not present

## 2022-10-07 DIAGNOSIS — F32A Depression, unspecified: Secondary | ICD-10-CM | POA: Diagnosis not present

## 2022-10-07 DIAGNOSIS — R45851 Suicidal ideations: Secondary | ICD-10-CM | POA: Diagnosis not present

## 2022-10-08 DIAGNOSIS — R45851 Suicidal ideations: Secondary | ICD-10-CM | POA: Diagnosis not present

## 2022-10-08 DIAGNOSIS — R079 Chest pain, unspecified: Secondary | ICD-10-CM | POA: Diagnosis not present

## 2022-10-28 DIAGNOSIS — E66813 Obesity, class 3: Secondary | ICD-10-CM | POA: Diagnosis not present

## 2022-10-28 DIAGNOSIS — Z6841 Body Mass Index (BMI) 40.0 and over, adult: Secondary | ICD-10-CM | POA: Diagnosis not present

## 2022-11-25 DIAGNOSIS — E66813 Obesity, class 3: Secondary | ICD-10-CM | POA: Diagnosis not present

## 2022-11-25 DIAGNOSIS — R030 Elevated blood-pressure reading, without diagnosis of hypertension: Secondary | ICD-10-CM | POA: Diagnosis not present

## 2022-11-25 DIAGNOSIS — N939 Abnormal uterine and vaginal bleeding, unspecified: Secondary | ICD-10-CM | POA: Diagnosis not present

## 2022-11-25 DIAGNOSIS — Z6841 Body Mass Index (BMI) 40.0 and over, adult: Secondary | ICD-10-CM | POA: Diagnosis not present

## 2022-12-15 DIAGNOSIS — J45909 Unspecified asthma, uncomplicated: Secondary | ICD-10-CM | POA: Diagnosis not present

## 2022-12-15 DIAGNOSIS — Z79899 Other long term (current) drug therapy: Secondary | ICD-10-CM | POA: Diagnosis not present

## 2022-12-15 DIAGNOSIS — N920 Excessive and frequent menstruation with regular cycle: Secondary | ICD-10-CM | POA: Diagnosis not present

## 2022-12-15 DIAGNOSIS — R102 Pelvic and perineal pain: Secondary | ICD-10-CM | POA: Diagnosis not present

## 2022-12-15 DIAGNOSIS — F419 Anxiety disorder, unspecified: Secondary | ICD-10-CM | POA: Diagnosis not present

## 2022-12-15 DIAGNOSIS — Z881 Allergy status to other antibiotic agents status: Secondary | ICD-10-CM | POA: Diagnosis not present

## 2022-12-15 DIAGNOSIS — N938 Other specified abnormal uterine and vaginal bleeding: Secondary | ICD-10-CM | POA: Diagnosis not present

## 2022-12-15 DIAGNOSIS — Z87891 Personal history of nicotine dependence: Secondary | ICD-10-CM | POA: Diagnosis not present

## 2022-12-16 DIAGNOSIS — N920 Excessive and frequent menstruation with regular cycle: Secondary | ICD-10-CM | POA: Diagnosis not present

## 2023-01-02 DIAGNOSIS — Z309 Encounter for contraceptive management, unspecified: Secondary | ICD-10-CM | POA: Diagnosis not present

## 2023-01-02 DIAGNOSIS — N939 Abnormal uterine and vaginal bleeding, unspecified: Secondary | ICD-10-CM | POA: Diagnosis not present

## 2023-01-02 DIAGNOSIS — N760 Acute vaginitis: Secondary | ICD-10-CM | POA: Diagnosis not present

## 2023-01-02 DIAGNOSIS — Z720 Tobacco use: Secondary | ICD-10-CM | POA: Diagnosis not present

## 2023-01-02 DIAGNOSIS — R110 Nausea: Secondary | ICD-10-CM | POA: Diagnosis not present

## 2023-01-02 DIAGNOSIS — Z113 Encounter for screening for infections with a predominantly sexual mode of transmission: Secondary | ICD-10-CM | POA: Diagnosis not present

## 2023-01-02 DIAGNOSIS — B9689 Other specified bacterial agents as the cause of diseases classified elsewhere: Secondary | ICD-10-CM | POA: Diagnosis not present

## 2023-01-02 DIAGNOSIS — K5900 Constipation, unspecified: Secondary | ICD-10-CM | POA: Diagnosis not present

## 2023-01-02 DIAGNOSIS — E669 Obesity, unspecified: Secondary | ICD-10-CM | POA: Diagnosis not present

## 2023-01-02 DIAGNOSIS — N858 Other specified noninflammatory disorders of uterus: Secondary | ICD-10-CM | POA: Diagnosis not present

## 2023-01-02 DIAGNOSIS — R1032 Left lower quadrant pain: Secondary | ICD-10-CM | POA: Diagnosis not present

## 2023-01-24 DIAGNOSIS — J168 Pneumonia due to other specified infectious organisms: Secondary | ICD-10-CM | POA: Diagnosis not present

## 2023-01-24 DIAGNOSIS — R0602 Shortness of breath: Secondary | ICD-10-CM | POA: Diagnosis not present

## 2023-01-24 DIAGNOSIS — J069 Acute upper respiratory infection, unspecified: Secondary | ICD-10-CM | POA: Diagnosis not present

## 2023-01-24 DIAGNOSIS — I445 Left posterior fascicular block: Secondary | ICD-10-CM | POA: Diagnosis not present

## 2023-01-24 DIAGNOSIS — Z87891 Personal history of nicotine dependence: Secondary | ICD-10-CM | POA: Diagnosis not present

## 2023-01-24 DIAGNOSIS — R059 Cough, unspecified: Secondary | ICD-10-CM | POA: Diagnosis not present

## 2023-02-04 DIAGNOSIS — F331 Major depressive disorder, recurrent, moderate: Secondary | ICD-10-CM | POA: Diagnosis not present

## 2023-02-06 DIAGNOSIS — F329 Major depressive disorder, single episode, unspecified: Secondary | ICD-10-CM | POA: Diagnosis not present

## 2023-03-03 DIAGNOSIS — F4323 Adjustment disorder with mixed anxiety and depressed mood: Secondary | ICD-10-CM | POA: Diagnosis not present

## 2023-03-22 DIAGNOSIS — B349 Viral infection, unspecified: Secondary | ICD-10-CM | POA: Diagnosis not present

## 2023-03-22 DIAGNOSIS — Z87891 Personal history of nicotine dependence: Secondary | ICD-10-CM | POA: Diagnosis not present

## 2023-03-22 DIAGNOSIS — J45901 Unspecified asthma with (acute) exacerbation: Secondary | ICD-10-CM | POA: Diagnosis not present

## 2023-03-22 DIAGNOSIS — R918 Other nonspecific abnormal finding of lung field: Secondary | ICD-10-CM | POA: Diagnosis not present

## 2023-03-22 DIAGNOSIS — R0602 Shortness of breath: Secondary | ICD-10-CM | POA: Diagnosis not present

## 2023-03-31 DIAGNOSIS — R197 Diarrhea, unspecified: Secondary | ICD-10-CM | POA: Diagnosis not present

## 2023-03-31 DIAGNOSIS — Z87828 Personal history of other (healed) physical injury and trauma: Secondary | ICD-10-CM | POA: Diagnosis not present

## 2023-03-31 DIAGNOSIS — R221 Localized swelling, mass and lump, neck: Secondary | ICD-10-CM | POA: Diagnosis not present

## 2023-03-31 DIAGNOSIS — Z309 Encounter for contraceptive management, unspecified: Secondary | ICD-10-CM | POA: Diagnosis not present

## 2023-03-31 DIAGNOSIS — R062 Wheezing: Secondary | ICD-10-CM | POA: Diagnosis not present

## 2023-03-31 DIAGNOSIS — Z8679 Personal history of other diseases of the circulatory system: Secondary | ICD-10-CM | POA: Diagnosis not present

## 2023-03-31 DIAGNOSIS — K5900 Constipation, unspecified: Secondary | ICD-10-CM | POA: Diagnosis not present

## 2023-03-31 DIAGNOSIS — R112 Nausea with vomiting, unspecified: Secondary | ICD-10-CM | POA: Diagnosis not present

## 2023-03-31 DIAGNOSIS — R1032 Left lower quadrant pain: Secondary | ICD-10-CM | POA: Diagnosis not present

## 2023-03-31 DIAGNOSIS — R1310 Dysphagia, unspecified: Secondary | ICD-10-CM | POA: Diagnosis not present

## 2023-03-31 DIAGNOSIS — R1084 Generalized abdominal pain: Secondary | ICD-10-CM | POA: Diagnosis not present

## 2023-03-31 DIAGNOSIS — R5383 Other fatigue: Secondary | ICD-10-CM | POA: Diagnosis not present

## 2023-03-31 DIAGNOSIS — R051 Acute cough: Secondary | ICD-10-CM | POA: Diagnosis not present

## 2023-04-01 DIAGNOSIS — J029 Acute pharyngitis, unspecified: Secondary | ICD-10-CM | POA: Diagnosis not present

## 2023-04-01 DIAGNOSIS — J4551 Severe persistent asthma with (acute) exacerbation: Secondary | ICD-10-CM | POA: Diagnosis not present

## 2023-04-01 DIAGNOSIS — D72823 Leukemoid reaction: Secondary | ICD-10-CM | POA: Diagnosis not present

## 2023-04-01 DIAGNOSIS — Z8719 Personal history of other diseases of the digestive system: Secondary | ICD-10-CM | POA: Diagnosis not present

## 2023-04-01 DIAGNOSIS — Z9081 Acquired absence of spleen: Secondary | ICD-10-CM | POA: Diagnosis not present

## 2023-04-01 DIAGNOSIS — K5900 Constipation, unspecified: Secondary | ICD-10-CM | POA: Diagnosis not present

## 2023-04-01 DIAGNOSIS — S36200S Unspecified injury of head of pancreas, sequela: Secondary | ICD-10-CM | POA: Diagnosis not present

## 2023-04-01 DIAGNOSIS — R1084 Generalized abdominal pain: Secondary | ICD-10-CM | POA: Diagnosis not present

## 2023-04-01 DIAGNOSIS — Z8679 Personal history of other diseases of the circulatory system: Secondary | ICD-10-CM | POA: Diagnosis not present

## 2023-04-01 DIAGNOSIS — R112 Nausea with vomiting, unspecified: Secondary | ICD-10-CM | POA: Diagnosis not present

## 2023-04-01 DIAGNOSIS — S36400S Unspecified injury of duodenum, sequela: Secondary | ICD-10-CM | POA: Diagnosis not present

## 2023-04-01 DIAGNOSIS — I10 Essential (primary) hypertension: Secondary | ICD-10-CM | POA: Diagnosis not present

## 2023-04-17 DIAGNOSIS — Z9081 Acquired absence of spleen: Secondary | ICD-10-CM | POA: Diagnosis not present

## 2023-04-17 DIAGNOSIS — S36400S Unspecified injury of duodenum, sequela: Secondary | ICD-10-CM | POA: Diagnosis not present

## 2023-04-17 DIAGNOSIS — K5900 Constipation, unspecified: Secondary | ICD-10-CM | POA: Diagnosis not present

## 2023-04-17 DIAGNOSIS — I10 Essential (primary) hypertension: Secondary | ICD-10-CM | POA: Diagnosis not present

## 2023-04-17 DIAGNOSIS — R1084 Generalized abdominal pain: Secondary | ICD-10-CM | POA: Diagnosis not present

## 2023-04-17 DIAGNOSIS — Z8719 Personal history of other diseases of the digestive system: Secondary | ICD-10-CM | POA: Diagnosis not present

## 2023-04-17 DIAGNOSIS — D72823 Leukemoid reaction: Secondary | ICD-10-CM | POA: Diagnosis not present

## 2023-04-17 DIAGNOSIS — S36200S Unspecified injury of head of pancreas, sequela: Secondary | ICD-10-CM | POA: Diagnosis not present

## 2023-04-17 DIAGNOSIS — R112 Nausea with vomiting, unspecified: Secondary | ICD-10-CM | POA: Diagnosis not present

## 2023-04-17 DIAGNOSIS — J4551 Severe persistent asthma with (acute) exacerbation: Secondary | ICD-10-CM | POA: Diagnosis not present

## 2023-04-17 DIAGNOSIS — R221 Localized swelling, mass and lump, neck: Secondary | ICD-10-CM | POA: Diagnosis not present

## 2023-04-17 DIAGNOSIS — R1032 Left lower quadrant pain: Secondary | ICD-10-CM | POA: Diagnosis not present

## 2023-04-17 DIAGNOSIS — Z8679 Personal history of other diseases of the circulatory system: Secondary | ICD-10-CM | POA: Diagnosis not present

## 2023-04-17 DIAGNOSIS — J029 Acute pharyngitis, unspecified: Secondary | ICD-10-CM | POA: Diagnosis not present

## 2023-04-21 DIAGNOSIS — J4550 Severe persistent asthma, uncomplicated: Secondary | ICD-10-CM | POA: Diagnosis not present

## 2023-05-14 DIAGNOSIS — R062 Wheezing: Secondary | ICD-10-CM | POA: Diagnosis not present

## 2023-05-14 DIAGNOSIS — Z87828 Personal history of other (healed) physical injury and trauma: Secondary | ICD-10-CM | POA: Diagnosis not present

## 2023-05-15 DIAGNOSIS — Z87828 Personal history of other (healed) physical injury and trauma: Secondary | ICD-10-CM | POA: Diagnosis not present

## 2023-05-15 DIAGNOSIS — R062 Wheezing: Secondary | ICD-10-CM | POA: Diagnosis not present

## 2023-05-28 DIAGNOSIS — S80212A Abrasion, left knee, initial encounter: Secondary | ICD-10-CM | POA: Diagnosis not present

## 2023-05-28 DIAGNOSIS — Z043 Encounter for examination and observation following other accident: Secondary | ICD-10-CM | POA: Diagnosis not present

## 2023-05-28 DIAGNOSIS — S0990XA Unspecified injury of head, initial encounter: Secondary | ICD-10-CM | POA: Diagnosis not present

## 2023-05-28 DIAGNOSIS — W19XXXA Unspecified fall, initial encounter: Secondary | ICD-10-CM | POA: Diagnosis not present

## 2023-05-28 DIAGNOSIS — M25512 Pain in left shoulder: Secondary | ICD-10-CM | POA: Diagnosis not present

## 2023-05-28 DIAGNOSIS — R519 Headache, unspecified: Secondary | ICD-10-CM | POA: Diagnosis not present

## 2023-05-28 DIAGNOSIS — R079 Chest pain, unspecified: Secondary | ICD-10-CM | POA: Diagnosis not present

## 2023-05-28 DIAGNOSIS — R0781 Pleurodynia: Secondary | ICD-10-CM | POA: Diagnosis not present

## 2023-05-28 DIAGNOSIS — I10 Essential (primary) hypertension: Secondary | ICD-10-CM | POA: Diagnosis not present

## 2023-05-28 DIAGNOSIS — Z87891 Personal history of nicotine dependence: Secondary | ICD-10-CM | POA: Diagnosis not present

## 2023-05-28 DIAGNOSIS — S161XXA Strain of muscle, fascia and tendon at neck level, initial encounter: Secondary | ICD-10-CM | POA: Diagnosis not present

## 2023-05-28 DIAGNOSIS — S299XXA Unspecified injury of thorax, initial encounter: Secondary | ICD-10-CM | POA: Diagnosis not present

## 2023-05-28 DIAGNOSIS — S8392XA Sprain of unspecified site of left knee, initial encounter: Secondary | ICD-10-CM | POA: Diagnosis not present

## 2023-05-28 DIAGNOSIS — R531 Weakness: Secondary | ICD-10-CM | POA: Diagnosis not present

## 2023-05-29 DIAGNOSIS — S0990XA Unspecified injury of head, initial encounter: Secondary | ICD-10-CM | POA: Diagnosis not present

## 2023-05-29 DIAGNOSIS — S299XXA Unspecified injury of thorax, initial encounter: Secondary | ICD-10-CM | POA: Diagnosis not present

## 2023-05-29 DIAGNOSIS — Z043 Encounter for examination and observation following other accident: Secondary | ICD-10-CM | POA: Diagnosis not present

## 2023-06-25 ENCOUNTER — Telehealth: Payer: Self-pay

## 2023-06-25 DIAGNOSIS — F4310 Post-traumatic stress disorder, unspecified: Secondary | ICD-10-CM

## 2023-06-26 ENCOUNTER — Telehealth: Payer: Self-pay | Admitting: *Deleted

## 2023-06-26 NOTE — Progress Notes (Signed)
 Complex Care Management Note  Care Guide Note 06/26/2023 Name: Vantasia Pinkney MRN: [id number] DOB: 01/31/1985  Glenice Lang Easler is a 38 y.o. year old female who declined to verify pcp or connect with [facility]   Encounter Outcome:  Patient Refused  Kandis Ormond, CMA [place]  [facility], [facility] Guide Direct Dial: [phone]  Fax: [phone] Website: [email or web address]

## 2023-07-21 DIAGNOSIS — Z3042 Encounter for surveillance of injectable contraceptive: Secondary | ICD-10-CM | POA: Diagnosis not present

## 2023-08-05 ENCOUNTER — Telehealth: Payer: Self-pay

## 2023-08-05 DIAGNOSIS — R079 Chest pain, unspecified: Secondary | ICD-10-CM

## 2023-08-05 NOTE — Progress Notes (Signed)
 Complex Care Management Note Care Guide Note  08/05/2023 Name: Maguire Killmer MRN: [id number] DOB: 1985/08/07   Complex Care Management Outreach Attempts: An unsuccessful telephone outreach was attempted today to offer the patient information about available complex care management services.  Follow Up Plan:  Additional outreach attempts will be made to offer the patient complex care management information and services.   Encounter Outcome:  No Answer  Dreama [facility]  [facility], [facility] Health Care Management Assistant Direct Dial: [phone]  Fax: [phone]

## 2023-08-06 NOTE — Progress Notes (Signed)
 Complex Care Management Note Care Guide Note  08/06/2023 Name: Rebecca Hatfield MRN: [id number] DOB: [birth year]   Complex Care Management Outreach Attempts: A second unsuccessful outreach was attempted today to offer the patient with information about available complex care management services.  Follow Up Plan:  Additional outreach attempts will be made to offer the patient complex care management information and services.   Encounter Outcome:  No Answer  Dreama [facility]  [facility], [facility] Health Care Management Assistant Direct Dial: [phone]  Fax: [phone]

## 2023-08-07 NOTE — Progress Notes (Signed)
 Complex Care Management Note Care Guide Note  08/07/2023 Name: Rebecca Hatfield MRN: [id number] DOB: November 07, 1985   Complex Care Management Outreach Attempts: A third unsuccessful outreach was attempted today to offer the patient with information about available complex care management services.  Follow Up Plan:  No further outreach attempts will be made at this time. We have been unable to contact the patient to offer or enroll patient in complex care management services.  Encounter Outcome:  Wrong number.  Dreama [facility]  [facility], [facility] Health Care Management Assistant Direct Dial: [phone]  Fax: [phone]

## 2023-08-20 ENCOUNTER — Ambulatory Visit
Admission: EM | Admit: 2023-08-20 | Discharge: 2023-08-20 | Disposition: A | Discharge status: Home / Self Care | Attending: Family Medicine | Admitting: Family Medicine

## 2023-08-20 ENCOUNTER — Other Ambulatory Visit: Payer: Self-pay

## 2023-08-20 ENCOUNTER — Ambulatory Visit

## 2023-08-20 DIAGNOSIS — H60502 Unspecified acute noninfective otitis externa, left ear: Secondary | ICD-10-CM | POA: Diagnosis not present

## 2023-08-20 DIAGNOSIS — J189 Pneumonia, unspecified organism: Secondary | ICD-10-CM | POA: Diagnosis not present

## 2023-08-20 DIAGNOSIS — T162XXA Foreign body in left ear, initial encounter: Secondary | ICD-10-CM

## 2023-08-20 DIAGNOSIS — R062 Wheezing: Secondary | ICD-10-CM

## 2023-08-20 DIAGNOSIS — R0602 Shortness of breath: Secondary | ICD-10-CM

## 2023-08-20 DIAGNOSIS — R059 Cough, unspecified: Secondary | ICD-10-CM | POA: Diagnosis not present

## 2023-08-20 DIAGNOSIS — J929 Pleural plaque without asbestos: Secondary | ICD-10-CM | POA: Diagnosis not present

## 2023-08-20 HISTORY — DX: Migraine, unspecified, not intractable, without status migrainosus: G43.909

## 2023-08-20 HISTORY — DX: Unspecified asthma, uncomplicated: J45.909

## 2023-08-20 HISTORY — DX: Abnormal uterine and vaginal bleeding, unspecified: N93.9

## 2023-08-20 MED ORDER — ALBUTEROL SULFATE HFA 108 (90 BASE) MCG/ACT IN AERS: 3.0000 | INHALATION_SPRAY | Freq: Four times a day (QID) | RESPIRATORY_TRACT | 0 refills | Status: AC | PRN

## 2023-08-20 MED ORDER — CEFTRIAXONE SODIUM 500 MG IJ SOLR
500.0000 mg | Freq: Once | INTRAMUSCULAR | Status: AC
  Administered 2023-08-20: 500 mg via INTRAMUSCULAR

## 2023-08-20 MED ORDER — CITALOPRAM HYDROBROMIDE 20 MG PO TABS: ORAL_TABLET | Freq: Every day | ORAL | 0 refills | Status: AC

## 2023-08-20 MED ORDER — PANTOPRAZOLE SODIUM 40 MG PO TBEC: 40.0000 mg | DELAYED_RELEASE_TABLET | Freq: Every day | ORAL | 0 refills | Status: AC

## 2023-08-20 MED ORDER — CIPROFLOXACIN-DEXAMETHASONE 0.3-0.1 % OT SUSP
4.0000 [drp] | Freq: Two times a day (BID) | OTIC | 0 refills | Status: AC
Start: 2023-08-20 — End: ?

## 2023-08-20 MED ORDER — AMLODIPINE BESYLATE 5 MG PO TABS: 5.0000 mg | ORAL_TABLET | Freq: Every day | ORAL | 0 refills | Status: AC

## 2023-08-20 MED ORDER — CEFDINIR 300 MG PO CAPS: 300.0000 mg | ORAL_CAPSULE | Freq: Two times a day (BID) | ORAL | 0 refills | Status: AC

## 2023-08-20 MED ORDER — PROMETHAZINE-DM 6.25-15 MG/5ML PO SYRP: 5.0000 mL | ORAL_SOLUTION | Freq: Four times a day (QID) | ORAL | 0 refills | Status: AC | PRN
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
## 2023-08-20 NOTE — Discharge Instructions (Addendum)
 Foreign body left ear: Lavage and retrieval successful and foreign body removed.  Otitis externa or swimmer's ear of left ear: Ear has become infected due to the foreign body in the ear.  Ciprodex  drops, 4 drops into left ear twice daily for 5 to 7 days.  Community-acquired pneumonia with cough, wheezing and shortness of breath: Ceftriaxone  500 mg injection now.  Albuterol  inhaler with spacer, 2 puffs every 4 hours as needed for wheezing.  Get plenty of fluids and rest.  Cefdinir  300 mg twice daily for 7 days.  Follow-up with primary care in the next 4 to 5 weeks.  Needs a recheck of left ear, lungs and needs to be seen for medication renewal.  All medications that I could renew were renewed today to help her until she can see primary care.

## 2023-08-20 NOTE — ED Triage Notes (Addendum)
 C/o wheezing and coughing x 1 week. Also has qtip stuck in left ear. Out of all meds x 4 days

## 2023-09-03 DIAGNOSIS — R0789 Other chest pain: Secondary | ICD-10-CM | POA: Diagnosis not present

## 2023-09-03 DIAGNOSIS — G8918 Other acute postprocedural pain: Secondary | ICD-10-CM | POA: Diagnosis not present

## 2023-09-03 DIAGNOSIS — G588 Other specified mononeuropathies: Secondary | ICD-10-CM | POA: Diagnosis not present

## 2023-09-03 DIAGNOSIS — G8912 Acute post-thoracotomy pain: Secondary | ICD-10-CM | POA: Diagnosis not present

## 2023-09-21 DIAGNOSIS — R079 Chest pain, unspecified: Secondary | ICD-10-CM | POA: Diagnosis not present

## 2023-09-21 DIAGNOSIS — J069 Acute upper respiratory infection, unspecified: Secondary | ICD-10-CM | POA: Diagnosis not present

## 2023-09-21 DIAGNOSIS — B9789 Other viral agents as the cause of diseases classified elsewhere: Secondary | ICD-10-CM | POA: Diagnosis not present

## 2023-09-21 DIAGNOSIS — R0602 Shortness of breath: Secondary | ICD-10-CM | POA: Diagnosis not present

## 2023-09-21 DIAGNOSIS — R000 Tachycardia, unspecified: Secondary | ICD-10-CM | POA: Diagnosis not present

## 2023-09-21 DIAGNOSIS — J028 Acute pharyngitis due to other specified organisms: Secondary | ICD-10-CM | POA: Diagnosis not present

## 2023-09-21 DIAGNOSIS — R059 Cough, unspecified: Secondary | ICD-10-CM | POA: Diagnosis not present

## 2023-09-21 DIAGNOSIS — Z87891 Personal history of nicotine dependence: Secondary | ICD-10-CM | POA: Diagnosis not present

## 2023-09-21 DIAGNOSIS — R109 Unspecified abdominal pain: Secondary | ICD-10-CM | POA: Diagnosis not present

## 2023-09-21 DIAGNOSIS — J984 Other disorders of lung: Secondary | ICD-10-CM | POA: Diagnosis not present

## 2023-09-24 DIAGNOSIS — G588 Other specified mononeuropathies: Secondary | ICD-10-CM | POA: Diagnosis not present

## 2023-09-24 DIAGNOSIS — G8912 Acute post-thoracotomy pain: Secondary | ICD-10-CM | POA: Diagnosis not present

## 2023-10-09 DIAGNOSIS — F319 Bipolar disorder, unspecified: Secondary | ICD-10-CM | POA: Diagnosis not present

## 2023-10-09 DIAGNOSIS — Z6838 Body mass index (BMI) 38.0-38.9, adult: Secondary | ICD-10-CM | POA: Diagnosis not present

## 2023-10-09 DIAGNOSIS — G8912 Acute post-thoracotomy pain: Secondary | ICD-10-CM | POA: Diagnosis not present

## 2023-10-09 DIAGNOSIS — J4550 Severe persistent asthma, uncomplicated: Secondary | ICD-10-CM | POA: Diagnosis not present

## 2023-10-09 DIAGNOSIS — I10 Essential (primary) hypertension: Secondary | ICD-10-CM | POA: Diagnosis not present

## 2023-10-09 DIAGNOSIS — Z9081 Acquired absence of spleen: Secondary | ICD-10-CM | POA: Diagnosis not present

## 2023-10-09 DIAGNOSIS — N938 Other specified abnormal uterine and vaginal bleeding: Secondary | ICD-10-CM | POA: Diagnosis not present

## 2023-12-11 ENCOUNTER — Emergency Department (HOSPITAL_COMMUNITY)

## 2023-12-11 ENCOUNTER — Encounter (HOSPITAL_COMMUNITY): Payer: Self-pay

## 2023-12-11 ENCOUNTER — Emergency Department (HOSPITAL_COMMUNITY)
Admission: EM | Admit: 2023-12-11 | Discharge: 2023-12-11 | Disposition: A | Discharge status: Home / Self Care | Attending: Emergency Medicine | Admitting: Emergency Medicine

## 2023-12-11 DIAGNOSIS — J45909 Unspecified asthma, uncomplicated: Secondary | ICD-10-CM | POA: Insufficient documentation

## 2023-12-11 DIAGNOSIS — I10 Essential (primary) hypertension: Secondary | ICD-10-CM | POA: Diagnosis not present

## 2023-12-11 DIAGNOSIS — R079 Chest pain, unspecified: Secondary | ICD-10-CM | POA: Diagnosis not present

## 2023-12-11 DIAGNOSIS — R0602 Shortness of breath: Secondary | ICD-10-CM | POA: Insufficient documentation

## 2023-12-11 DIAGNOSIS — R7989 Other specified abnormal findings of blood chemistry: Secondary | ICD-10-CM | POA: Diagnosis not present

## 2023-12-11 DIAGNOSIS — R042 Hemoptysis: Secondary | ICD-10-CM | POA: Diagnosis not present

## 2023-12-11 DIAGNOSIS — R062 Wheezing: Secondary | ICD-10-CM | POA: Insufficient documentation

## 2023-12-11 DIAGNOSIS — R112 Nausea with vomiting, unspecified: Secondary | ICD-10-CM | POA: Diagnosis not present

## 2023-12-11 DIAGNOSIS — J984 Other disorders of lung: Secondary | ICD-10-CM | POA: Diagnosis not present

## 2023-12-11 LAB — COMPREHENSIVE METABOLIC PANEL WITH GFR
ALT: 16 U/L (ref 0–44)
AST: 15 U/L (ref 15–41)
Albumin: 3 g/dL — ABNORMAL LOW (ref 3.5–5.0)
Alkaline Phosphatase: 71 U/L (ref 38–126)
Anion gap: 9 (ref 5–15)
BUN: 8 mg/dL (ref 6–20)
CO2: 19 mmol/L — ABNORMAL LOW (ref 22–32)
Calcium: 8.4 mg/dL — ABNORMAL LOW (ref 8.9–10.3)
Chloride: 113 mmol/L — ABNORMAL HIGH (ref 98–111)
Creatinine, Ser: 0.89 mg/dL (ref 0.44–1.00)
GFR, Estimated: >60 mL/min (ref >60)
Glucose, Bld: 97 mg/dL (ref 70–99)
Potassium: 4 mmol/L (ref 3.5–5.1)
Sodium: 141 mmol/L (ref 135–145)
Total Bilirubin: 0.6 mg/dL (ref 0.0–1.2)
Total Protein: 6.3 g/dL — ABNORMAL LOW (ref 6.5–8.1)

## 2023-12-11 LAB — CBC WITH DIFFERENTIAL/PLATELET
Abs Immature Granulocytes: 0.04 K/uL (ref 0.00–0.07)
Basophils Absolute: 0.1 K/uL (ref 0.0–0.1)
Basophils Relative: 1 %
Eosinophils Absolute: 0 K/uL (ref 0.0–0.5)
Eosinophils Relative: 0 %
HCT: 39.9 % (ref 36.0–46.0)
Hemoglobin: 13.1 g/dL (ref 12.0–15.0)
Immature Granulocytes: 0 %
Lymphocytes Relative: 14 %
Lymphs Abs: 1.4 K/uL (ref 0.7–4.0)
MCH: 31.6 pg (ref 26.0–34.0)
MCHC: 32.8 g/dL (ref 30.0–36.0)
MCV: 96.4 fL (ref 80.0–100.0)
Monocytes Absolute: 0.2 K/uL (ref 0.1–1.0)
Monocytes Relative: 2 %
Neutro Abs: 8.4 K/uL — ABNORMAL HIGH (ref 1.7–7.7)
Neutrophils Relative %: 83 %
Platelets: 356 K/uL (ref 150–400)
RBC: 4.14 MIL/uL (ref 3.87–5.11)
RDW: 15 % (ref 11.5–15.5)
WBC: 10 K/uL (ref 4.0–10.5)
nRBC: 0 % (ref 0.0–0.2)

## 2023-12-11 LAB — HCG, SERUM, QUALITATIVE: Preg, Serum: NEGATIVE

## 2023-12-11 LAB — TROPONIN I (HIGH SENSITIVITY)
Troponin I (High Sensitivity): 4 ng/L (ref <18)
Troponin I (High Sensitivity): 4 ng/L (ref <18)

## 2023-12-11 LAB — D-DIMER, QUANTITATIVE: D-Dimer, Quant: 2.32 ug{FEU}/mL — ABNORMAL HIGH (ref 0.00–0.50)

## 2023-12-11 MED ORDER — METHYLPREDNISOLONE SODIUM SUCC 125 MG IJ SOLR
125.0000 mg | Freq: Once | INTRAMUSCULAR | Status: AC
  Administered 2023-12-11: 125 mg via INTRAVENOUS
  Filled 2023-12-11: qty 2

## 2023-12-11 MED ORDER — ONDANSETRON HCL 4 MG/2ML IJ SOLN
4.0000 mg | Freq: Once | INTRAMUSCULAR | Status: AC
  Administered 2023-12-11: 4 mg via INTRAVENOUS
  Filled 2023-12-11: qty 2

## 2023-12-11 MED ORDER — IPRATROPIUM-ALBUTEROL 0.5-2.5 (3) MG/3ML IN SOLN
3.0000 mL | Freq: Once | RESPIRATORY_TRACT | Status: AC
  Administered 2023-12-11: 3 mL via RESPIRATORY_TRACT
  Filled 2023-12-11: qty 3

## 2023-12-11 MED ORDER — IOHEXOL 350 MG/ML SOLN
75.0000 mL | Freq: Once | INTRAVENOUS | Status: AC | PRN
  Administered 2023-12-11: 75 mL via INTRAVENOUS

## 2023-12-11 MED ORDER — KETOROLAC TROMETHAMINE 15 MG/ML IJ SOLN
15.0000 mg | Freq: Once | INTRAMUSCULAR | Status: AC
  Administered 2023-12-11: 15 mg via INTRAVENOUS
  Filled 2023-12-11: qty 1
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
## 2023-12-11 NOTE — Discharge Instructions (Signed)
 It was a pleasure caring for you today in the emergency department.  Please return to the emergency department for any worsening or worrisome symptoms.
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
